# Patient Record
Sex: Female | Born: 1985 | Hispanic: Yes | Marital: Married | Smoking: Never smoker
Health system: Southern US, Community
[De-identification: ages and names within clinical notes are randomized; demographics above are authoritative.]

## PROBLEM LIST (undated history)

## (undated) ENCOUNTER — Inpatient Hospital Stay (HOSPITAL_COMMUNITY): Payer: Self-pay

## (undated) DIAGNOSIS — O4442 Low lying placenta NOS or without hemorrhage, second trimester: Secondary | ICD-10-CM

## (undated) DIAGNOSIS — Z789 Other specified health status: Secondary | ICD-10-CM

## (undated) DIAGNOSIS — O09292 Supervision of pregnancy with other poor reproductive or obstetric history, second trimester: Secondary | ICD-10-CM

## (undated) DIAGNOSIS — O008 Other ectopic pregnancy without intrauterine pregnancy: Secondary | ICD-10-CM

## (undated) DIAGNOSIS — O358XX0 Maternal care for other (suspected) fetal abnormality and damage, not applicable or unspecified: Secondary | ICD-10-CM

## (undated) HISTORY — DX: Other ectopic pregnancy without intrauterine pregnancy: O00.80

## (undated) HISTORY — PX: DILATION AND CURETTAGE OF UTERUS: SHX78

---
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
## 2005-10-05 ENCOUNTER — Inpatient Hospital Stay (HOSPITAL_COMMUNITY): Admission: AD | Admit: 2005-10-05 | Discharge: 2005-10-07 | Payer: Self-pay | Admitting: *Deleted

## 2012-05-10 ENCOUNTER — Ambulatory Visit: Payer: Self-pay | Admitting: Physician Assistant

## 2012-05-10 VITALS — BP 99/62 | HR 73 | Temp 98.1°F | Resp 16 | Ht 61.0 in | Wt 128.8 lb

## 2012-05-10 DIAGNOSIS — R82998 Other abnormal findings in urine: Secondary | ICD-10-CM

## 2012-05-10 DIAGNOSIS — M549 Dorsalgia, unspecified: Secondary | ICD-10-CM

## 2012-05-10 DIAGNOSIS — Z3490 Encounter for supervision of normal pregnancy, unspecified, unspecified trimester: Secondary | ICD-10-CM | POA: Insufficient documentation

## 2012-05-10 DIAGNOSIS — R109 Unspecified abdominal pain: Secondary | ICD-10-CM

## 2012-05-10 DIAGNOSIS — N912 Amenorrhea, unspecified: Secondary | ICD-10-CM

## 2012-05-10 LAB — POCT URINALYSIS DIPSTICK
Bilirubin, UA: NEGATIVE
Blood, UA: NEGATIVE
Glucose, UA: NEGATIVE
Nitrite, UA: POSITIVE
Protein, UA: NEGATIVE
Spec Grav, UA: >=1.03
Urobilinogen, UA: 0.2
pH, UA: 5.5

## 2012-05-10 LAB — POCT UA - MICROSCOPIC ONLY
Casts, Ur, LPF, POC: NEGATIVE
Crystals, Ur, HPF, POC: NEGATIVE
Yeast, UA: NEGATIVE

## 2012-05-10 LAB — POCT URINE PREGNANCY: Preg Test, Ur: POSITIVE

## 2012-05-10 NOTE — Progress Notes (Signed)
[address], [place] [address]   Phone [phone]  Subjective:    Patient ID: Lindsay Gutierrez, female    DOB: Mar 13, 1986, 26 y.o.   MRN: [id number]  HPI Pt presents to clinic with low back pain for about the last 2 wks and wants to make sure it is ok.  She has had a + pregnancy test at home.  She has used tylenol which has helped and heat.  The pain mainly gets her while she is sleeping and then 1st thing in the am but goes away as she starts to move.  She has not injured her back that she can remember.  She is having no abd pain, vaginal d/c, bleeding and no urinary symptoms.  This is her 2nd pregnancy, she has a 26 y/o at home.     Review of Systems  Constitutional: Negative for fever and chills.  Gastrointestinal: Negative for nausea.  Genitourinary: Negative for dysuria, urgency, frequency, vaginal bleeding, vaginal discharge and pelvic pain.  Musculoskeletal: Positive for back pain. Negative for myalgias, joint swelling and arthralgias.       No paresthesias and pain into legs, no weakness       Objective:   Physical Exam  Vitals reviewed. Constitutional: She is oriented to person, place, and time. She appears well-developed and well-nourished.  HENT:  Head: Normocephalic and atraumatic.  Right Ear: External ear normal.  Left Ear: External ear normal.  Eyes: Conjunctivae normal are normal.  Cardiovascular: Normal rate, regular rhythm and normal heart sounds.   No murmur heard. Pulmonary/Chest: Effort normal and breath sounds normal.  Abdominal: Soft. Bowel sounds are normal. There is no tenderness.  Musculoskeletal:       Lumbar back: She exhibits no tenderness, no bony tenderness, no swelling and no edema.  Neurological: She is alert and oriented to person, place, and time. She has normal strength. No cranial nerve deficit or sensory deficit.  Reflex Scores:      Bicep reflexes are 2+ on the right side and 2+ on the left side.      Brachioradialis reflexes are 2+ on  the right side and 2+ on the left side.      Patellar reflexes are 2+ on the right side and 2+ on the left side. Skin: Skin is warm and dry.  Psychiatric: She has a normal mood and affect. Her behavior is normal. Judgment and thought content normal.    Results for orders placed in visit on 05/10/12  POCT UA - MICROSCOPIC ONLY      Component Value Range   WBC, Ur, HPF, POC 6-12     RBC, urine, microscopic 0-1     Bacteria, U Microscopic 4++     Mucus, UA large     Epithelial cells, urine per micros 7-15     Crystals, Ur, HPF, POC neg     Casts, Ur, LPF, POC neg     Yeast, UA neg    POCT URINALYSIS DIPSTICK      Component Value Range   Color, UA yellow     Clarity, UA clear     Glucose, UA neg     Bilirubin, UA neg     Ketones, UA trace     Spec Grav, UA >=1.030     Blood, UA neg     pH, UA 5.5     Protein, UA neg     Urobilinogen, UA 0.2     Nitrite, UA positive     Leukocytes,  UA Trace    POCT URINE PREGNANCY      Component Value Range   Preg Test, Ur Positive           Assessment & Plan:   1. Back pain    2. Abdominal pain  POCT UA - Microscopic Only, POCT urinalysis dipstick, POCT urine pregnancy  3. Pregnancy    4. Amenorrhea    5. Urine white blood cells increased  Urine culture   1- believe musculoskeletal in origin.  Pt can continue heat and tylenol.  Pt to do back stretches. 2- pt stated she did not have abd pain 3- pt to call and make an appt for OB, results of preg test given, pt to take an OTC PNV 4- 5- due to pregnancy will send urine culture - i think it is a dirty catch but will make sure through culture  Pt to stay hydrated.  D/w pt warning signs of problems with the pregnancy and when the RTC.  If back pain continues she should RTC.

## 2012-05-10 NOTE — Patient Instructions (Addendum)
Call health department and make an OB appt. [phone]  Take a prenatal vitamin daily.  Baby is due 11/24/2012 - you are 11 wks and 5 day

## 2012-05-15 MED ORDER — NITROFURANTOIN MONOHYD MACRO 100 MG PO CAPS: 100.0000 mg | ORAL_CAPSULE | Freq: Two times a day (BID) | ORAL | Status: DC

## 2012-05-15 NOTE — Addendum Note (Signed)
Addended by: Morrell Riddle on: 05/15/2012 08:13 AM   Modules accepted: Orders

## 2012-05-25 ENCOUNTER — Ambulatory Visit: Payer: Self-pay | Admitting: Family Medicine

## 2012-05-25 VITALS — BP 108/70 | HR 82 | Temp 98.2°F | Resp 24 | Ht 61.5 in | Wt 128.2 lb

## 2012-05-25 DIAGNOSIS — O039 Complete or unspecified spontaneous abortion without complication: Secondary | ICD-10-CM

## 2012-05-25 MED ORDER — HYDROCODONE-ACETAMINOPHEN 5-325 MG PO TABS: 1.0000 | ORAL_TABLET | Freq: Four times a day (QID) | ORAL | Status: DC | PRN

## 2012-05-25 NOTE — Progress Notes (Signed)
   [address] [place] [address]   Phone [phone]  Subjective:    Patient ID: Lindsay Gutierrez, female    DOB: 09-04-1985, 26 y.o.   MRN: [id number]  HPI  Pt presents to clinic with abd cramping and vaginal bleeding.  She is 13wk 6d pregnant and 3 days ago developed some light vaginal bleeding and some minor cramping.  Then over the weekend she developed increased bleeding and cramping (today is the worst).  She is having no fever. She does not know her blood type but does not think that she got a shot/injection after her delivery.  Review of Systems  Genitourinary: Positive for vaginal bleeding and pelvic pain.       Objective:   Physical Exam  Vitals reviewed. Constitutional: She appears well-developed and well-nourished.  HENT:  Head: Normocephalic and atraumatic.  Right Ear: External ear normal.  Left Ear: External ear normal.  Cardiovascular: Normal rate, regular rhythm and normal heart sounds.   Pulmonary/Chest: Effort normal.  Genitourinary: Pelvic exam was performed with patient supine. There is bleeding (blood collected in the vaginal canal) around the vagina.       Internal os is opened with tissue passing through.  I am unable to remove the tissue with a large cotton swab so it was left in place.    CBC was ok with WBC 10.4, Hgb 12.4, HCT 41.0, PLT 315  Visit interpreted by Fab - a medical assistant.     Assessment & Plan:   1. Spontaneous miscarriage  Rh Type, hCG, quantitative, pregnancy, HYDROcodone-acetaminophen (NORCO/VICODIN) 5-325 MG per tablet   D/w pt through interpretor at length her options of [place] following [facility].  Pt will plan to f/u here for recheck on Friday when I will repeat her vaginal exam and repeat her quan HCG.  I d/w pt that we will follow her labs until her Hcg is normal.  D/w pt things to be worried about and when to go to the ED (fever, increased pain and bleeding).  I except her pain to be improved in  the next 48h due to the tissue being at the os.  D/w Dr Conley Rolls

## 2012-05-25 NOTE — Patient Instructions (Addendum)
If you develop a fever, increased constant abd pain or significantly worse bleeding please go to [facility] hospital. We will see you every 3-4 days until your hormone level goes to zero to make sure the miscarriage is complete and you have no risk of infection.  We will call you with the results of your labs when we get them.  Aborto espontneo  (Miscarriage) El aborto espontneo es la prdida de un beb que no ha nacido (feto) antes de la semana 20 del Psychiatrist. La mayor parte de estos abortos ocurre en los primeros 3 meses. En algunos casos ocurre antes de que la mujer sepa que est [place]. Tambin se denomina "aborto espontneo" o "prdida prematura del embarazo". El aborto espontneo puede ser Neomia Dear experiencia que afecte emocionalmente a Dealer. Converse con su mdico si tiene dudas, cmo es el proceso de [place], y sobre planes futuros de Psychiatrist.  CAUSAS   Algunos problemas cromosmicos pueden hacer imposible que el beb se desarrolle normalmente. Los problemas con los genes o cromosomas del beb son generalmente el resultado de errores que se producen, por casualidad, cuando el embrin se divide y crece. Estos problemas no se heredan de los [place].  Infeccin en el cuello del tero.   Problemas hormonales.   Problemas en el cuello del tero, como tener un tero incompetente. Esto ocurre cuando los tejidos no son lo suficientemente fuertes como para Arts administrator.   Problemas del tero, como un tero con forma anormal, los fibromas o anormalidades congnitas.   Ciertas enfermedades crnicas.   No fume, no beba alcohol, ni consuma drogas.   Traumatismos  A veces, la causa es desconocida.  SNTOMAS   Sangrado o manchado vaginal, con o sin clicos o dolor.  Dolor o clicos en el abdomen o en la cintura.  Eliminacin de lquido, tejidos o cogulos grandes por la vagina. DIAGNSTICO  El [employer] har un examen fsico. Tambin le indicar una ecografa para  confirmar el aborto. Es posible que se realicen anlisis de [place].  TRATAMIENTO   En algunos casos el tratamiento no es necesario, si se eliminan naturalmente todos los tejidos embrionarios que se encontraban en el tero. Si el feto o la placenta quedan dentro del tero (aborto incompleto), pueden infectarse, los tejidos que quedan pueden infectarse y deben retirarse. Generalmente se realiza un procedimiento de dilatacin y curetaje (D y C). Durante el procedimiento de dilatacin y curetaje, el cuello del tero se abre (dilata) y se retira cualquier resto de tejido fetal o placentario del tero.  Si hay una infeccin, le recetarn antibiticos. Podrn recetarle otros medicamentos para reducir el tamao del tero (contraerlo) si hay una mucho sangrado.  Si su sangre es Rh negativa y su beb es Rh positivo, usted necesitar la inyeccin de inmunoglobulina Rh. Esta inyeccin proteger a los futuros bebs de tener problemas de compatibilidad Rh en futuros embarazos. INSTRUCCIONES PARA EL CUIDADO EN EL HOGAR   El mdico le indicar reposo en cama o le permitir Dance movement psychotherapist. Vuelva a la actividad lentamente o segn las indicaciones de su mdico.  Pdale a alguien que la ayude con las responsabilidades familiares y del hogar durante este tiempo.   Lleve un registro de la cantidad y la saturacin de las toallas higinicas que Landscape architect. [place] esta informacin   No use tampones. No No se haga duchas vaginales ni tenga relaciones sexuales hasta que el mdico la autorice.   Slo tome medicamentos de venta libre o recetados para  calmar el dolor o el [place], segn las indicaciones de su mdico.   No tome aspirina. La aspirina puede ocasionar hemorragias.   Concurra puntualmente a las citas de control con el mdico.   Si usted o su pareja tienen dificultades con el duelo, hable con su mdico para buscar la [employer] ayude a Runner, broadcasting/film/video la prdida del  Psychiatrist. Permtase el tiempo suficiente de duelo antes de quedar embarazada nuevamente.  SOLICITE ATENCIN MDICA DE INMEDIATO SI:   Siente calambres intensos o dolor en la espalda o en el abdomen.  Tiene fiebre.  Elimina grandes cogulos de [place] (del tamao de una nuez o ms) o tejidos por la vagina. Guarde lo que ha eliminado para que su mdico lo examine.   La hemorragia aumenta.   Brett Fairy secrecin vaginal espesa y con mal olor.  Se siente mareada, dbil, o se desmaya.   Siente escalofros.  ASEGRESE DE QUE:   Comprende estas instrucciones.  Controlar su enfermedad.  Solicitar ayuda de inmediato si no mejora o si empeora. Document Released: 03/12/2005 Document Revised: 12/02/2011 ExitCare Patient Information 2013 [facility]

## 2012-05-26 LAB — HCG, QUANTITATIVE, PREGNANCY: hCG, Beta Chain, Quant, S: 865.3 m[IU]/mL

## 2012-05-27 LAB — RH TYPE: Rh Type: POSITIVE

## 2012-05-28 ENCOUNTER — Ambulatory Visit: Payer: Self-pay | Admitting: Physician Assistant

## 2012-05-28 VITALS — BP 112/74 | HR 72 | Temp 98.0°F | Resp 16 | Ht 61.5 in | Wt 128.0 lb

## 2012-05-28 DIAGNOSIS — O039 Complete or unspecified spontaneous abortion without complication: Secondary | ICD-10-CM

## 2012-05-28 DIAGNOSIS — O219 Vomiting of pregnancy, unspecified: Secondary | ICD-10-CM

## 2012-05-28 DIAGNOSIS — Z3490 Encounter for supervision of normal pregnancy, unspecified, unspecified trimester: Secondary | ICD-10-CM

## 2012-05-28 LAB — POCT CBC
Granulocyte percent: 57.2 %G (ref 37–80)
Hemoglobin: 12.4 g/dL (ref 12.2–16.2)
Lymph, poc: 3.7 — AB (ref 0.6–3.4)
MCH, POC: 29.7 pg (ref 27–31.2)
MCV: 93.1 fL (ref 80–97)
POC Granulocyte: 5.7 (ref 2–6.9)
POC LYMPH PERCENT: 36.8 %L (ref 10–50)
POC MID %: 6 %M (ref 0–12)
RDW, POC: 13.7 %
WBC: 10 10*3/uL (ref 4.6–10.2)

## 2012-05-28 LAB — HCG, QUANTITATIVE, PREGNANCY: hCG, Beta Chain, Quant, S: 101.5 m[IU]/mL

## 2012-05-28 NOTE — Progress Notes (Signed)
   [address], [place] [address]   Phone [phone]  Subjective:    Patient ID: Lindsay Gutierrez, female    DOB: 06-Oct-1985, 26 y.o.   MRN: [id number]  HPI  Pt presents to clinic for recheck miscarriage - She is having less bleeding - still cramping that is intermittent.  Has not had to use pain meds - using tylenol.  She is having no fevers or chills.  She is Rh +.  Review of Systems  Constitutional: Negative for fever and chills.  Genitourinary: Positive for vaginal bleeding and pelvic pain.       Objective:   Physical Exam  Vitals reviewed. Constitutional: She appears well-developed and well-nourished.  Pulmonary/Chest: Effort normal.  Abdominal: Soft. There is no tenderness.  Genitourinary: Cervix exhibits no motion tenderness. No vaginal discharge found.       Internal os is open and bleeding.    Results for orders placed in visit on 05/28/12  POCT CBC      Component Value Range   WBC 10.0  4.6 - 10.2 K/uL   Lymph, poc 3.7 (*) 0.6 - 3.4   POC LYMPH PERCENT 36.8  10 - 50 %L   MID (cbc) 0.6  0 - 0.9   POC MID % 6.0  0 - 12 %M   POC Granulocyte 5.7  2 - 6.9   Granulocyte percent 57.2  37 - 80 %G   RBC 4.17  4.04 - 5.48 M/uL   Hemoglobin 12.4  12.2 - 16.2 g/dL   HCT, POC 21.3  08.6 - 47.9 %   MCV 93.1  80 - 97 fL   MCH, POC 29.7  27 - 31.2 pg   MCHC 32.0  31.8 - 35.4 g/dL   RDW, POC 57.8     Platelet Count, POC 296  142 - 424 K/uL   MPV 9.5  0 - 99.8 fL        Assessment & Plan:   1. Miscarriage  POCT CBC, hCG, quantitative, pregnancy   Will check her quan hcg today and monitor for decreasing.  CBC was normal today.  Will determine need for f/u after labs return.  Answered patient's questions.

## 2012-09-17 ENCOUNTER — Encounter (HOSPITAL_COMMUNITY): Payer: Self-pay

## 2012-09-17 ENCOUNTER — Inpatient Hospital Stay (HOSPITAL_COMMUNITY)
Admission: AD | Admit: 2012-09-17 | Discharge: 2012-09-18 | Disposition: A | Discharge status: Home / Self Care | Payer: Self-pay | Source: Ambulatory Visit | Attending: Obstetrics and Gynecology | Admitting: Obstetrics and Gynecology

## 2012-09-17 ENCOUNTER — Inpatient Hospital Stay (HOSPITAL_COMMUNITY): Payer: Self-pay

## 2012-09-17 DIAGNOSIS — O26891 Other specified pregnancy related conditions, first trimester: Secondary | ICD-10-CM

## 2012-09-17 DIAGNOSIS — O009 Unspecified ectopic pregnancy without intrauterine pregnancy: Secondary | ICD-10-CM

## 2012-09-17 DIAGNOSIS — N949 Unspecified condition associated with female genital organs and menstrual cycle: Secondary | ICD-10-CM | POA: Insufficient documentation

## 2012-09-17 DIAGNOSIS — O99891 Other specified diseases and conditions complicating pregnancy: Secondary | ICD-10-CM | POA: Insufficient documentation

## 2012-09-17 DIAGNOSIS — R109 Unspecified abdominal pain: Secondary | ICD-10-CM | POA: Insufficient documentation

## 2012-09-17 HISTORY — DX: Other specified health status: Z78.9

## 2012-09-17 LAB — WET PREP, GENITAL: Yeast Wet Prep HPF POC: NONE SEEN

## 2012-09-17 LAB — URINALYSIS, ROUTINE W REFLEX MICROSCOPIC
Glucose, UA: NEGATIVE mg/dL
Protein, ur: NEGATIVE mg/dL

## 2012-09-17 LAB — POCT PREGNANCY, URINE: Preg Test, Ur: POSITIVE — AB

## 2012-09-17 LAB — CBC
HCT: 35.3 % — ABNORMAL LOW (ref 36.0–46.0)
MCHC: 33.1 g/dL (ref 30.0–36.0)
Platelets: 293 10*3/uL (ref 150–400)
RBC: 3.94 MIL/uL (ref 3.87–5.11)

## 2012-09-17 LAB — URINE MICROSCOPIC-ADD ON

## 2012-09-17 LAB — ABO/RH: ABO/RH(D): O POS

## 2012-09-17 NOTE — MAU Note (Signed)
Abdominal pain since 1700. Cramping that comes and goes. No bleeding or d/c. Had miscarriage 5mos ago and I'm worried.

## 2012-09-17 NOTE — MAU Provider Note (Signed)
History     CSN: [id number]  Arrival date and time: 09/17/12 2107   None     Chief Complaint  Patient presents with  . Abdominal Pain   HPI  Lindsay Gutierrez is a 27 y.o. [id number] who presents today with 5/10 abdominal pain. She states that this pain is similar to the pain she had when she had a miscarriage 5 months ago. She states her LMP was 08/31/12, but she states that a little different than normal.   Past Medical History  Diagnosis Date  . Medical history non-contributory     No past surgical history on file.  No family history on file.  History  Substance Use Topics  . Smoking status: Never Smoker   . Smokeless tobacco: Never Used  . Alcohol Use: No    Allergies: No Known Allergies  Prescriptions prior to admission  Medication Sig Dispense Refill  . Prenatal Vit-Fe Fumarate-FA (PRENATAL MULTIVITAMIN) TABS Take 1 tablet by mouth daily at 12 noon.        Review of Systems  Constitutional: Negative for fever.  Eyes: Negative for blurred vision.  Gastrointestinal: Positive for abdominal pain (see HPI). Negative for nausea, vomiting, diarrhea and constipation.  Genitourinary: Negative for dysuria, urgency and frequency.  Musculoskeletal: Negative for myalgias.  Neurological: Positive for headaches (occasinally). Negative for dizziness.   Physical Exam   Blood pressure 99/72, pulse 79, temperature 98.2 F (36.8 C), resp. rate 18, height 5\' 1"  (1.549 m), weight 55.611 kg (122 lb 9.6 oz), last menstrual period 08/31/2012, SpO2 100.00%.  Physical Exam  Nursing note and vitals reviewed. Constitutional: She is oriented to person, place, and time. She appears well-developed and well-nourished. No distress.  Cardiovascular: Normal rate.   Respiratory: Effort normal.  GI: Soft. She exhibits no distension. There is no tenderness.  Neurological: She is alert and oriented to person, place, and time.  Skin: Skin is warm and dry.    MAU Course  Procedures  Results for  orders placed during the hospital encounter of 09/17/12 (from the past 24 hour(s))  URINALYSIS, ROUTINE W REFLEX MICROSCOPIC     Status: Abnormal   Collection Time    09/17/12  9:30 PM      Result Value Range   Color, Urine YELLOW  YELLOW   APPearance CLEAR  CLEAR   Specific Gravity, Urine 1.020  1.005 - 1.030   pH 7.0  5.0 - 8.0   Glucose, UA NEGATIVE  NEGATIVE mg/dL   Hgb urine dipstick NEGATIVE  NEGATIVE   Bilirubin Urine NEGATIVE  NEGATIVE   Ketones, ur NEGATIVE  NEGATIVE mg/dL   Protein, ur NEGATIVE  NEGATIVE mg/dL   Urobilinogen, UA 1.0  0.0 - 1.0 mg/dL   Nitrite NEGATIVE  NEGATIVE   Leukocytes, UA TRACE (*) NEGATIVE  URINE MICROSCOPIC-ADD ON     Status: Abnormal   Collection Time    09/17/12  9:30 PM      Result Value Range   Squamous Epithelial / LPF FEW (*) RARE   WBC, UA 0-2  <3 WBC/hpf   RBC / HPF 0-2  <3 RBC/hpf   Bacteria, UA FEW (*) RARE  POCT PREGNANCY, URINE     Status: Abnormal   Collection Time    09/17/12  9:34 PM      Result Value Range   Preg Test, Ur POSITIVE (*) NEGATIVE  CBC     Status: Abnormal   Collection Time    09/17/12  9:35 PM  Result Value Range   WBC 11.0 (*) 4.0 - 10.5 K/uL   RBC 3.94  3.87 - 5.11 MIL/uL   Hemoglobin 11.7 (*) 12.0 - 15.0 g/dL   HCT 16.1 (*) 09.6 - 04.5 %   MCV 89.6  78.0 - 100.0 fL   MCH 29.7  26.0 - 34.0 pg   MCHC 33.1  30.0 - 36.0 g/dL   RDW 40.9  81.1 - 91.4 %   Platelets 293  150 - 400 K/uL  ABO/RH     Status: None   Collection Time    09/17/12  9:35 PM      Result Value Range   ABO/RH(D) O POS    WET PREP, GENITAL     Status: Abnormal   Collection Time    09/17/12 10:05 PM      Result Value Range   Yeast Wet Prep HPF POC NONE SEEN  NONE SEEN   Trich, Wet Prep NONE SEEN  NONE SEEN   Clue Cells Wet Prep HPF POC FEW (*) NONE SEEN   WBC, Wet Prep HPF POC FEW (*) NONE SEEN  HCG, QUANTITATIVE, PREGNANCY     Status: Abnormal   Collection Time    09/17/12 10:30 PM      Result Value Range   hCG, Beta Chain,  Quant, S 139 (*) <5 mIU/mL   USE: Reveals an intrauterine gestational sac with no fetal pole or cardiac activity seen at this early gestation.  Assessment and Plan   1. Pelvic pain complicating pregnancy, antepartum, first trimester    Repeat quant in 48 hours First trimester danger signs reviewed Tawnya Crook 09/17/2012, 9:51 PM

## 2012-09-18 NOTE — MAU Provider Note (Signed)
Attestation of Attending Supervision of Advanced Practitioner (CNM/NP): Evaluation and management procedures were performed by the Advanced Practitioner under my supervision and collaboration.  I have reviewed the Advanced Practitioner's note and chart, and I agree with the management and plan.  Kiyoshi Schaab 09/18/2012 7:45 AM

## 2012-09-19 ENCOUNTER — Inpatient Hospital Stay (HOSPITAL_COMMUNITY)
Admission: AD | Admit: 2012-09-19 | Discharge: 2012-09-19 | Disposition: A | Discharge status: Home / Self Care | Payer: Self-pay | Source: Ambulatory Visit | Attending: Obstetrics & Gynecology | Admitting: Obstetrics & Gynecology

## 2012-09-19 DIAGNOSIS — Z09 Encounter for follow-up examination after completed treatment for conditions other than malignant neoplasm: Secondary | ICD-10-CM | POA: Insufficient documentation

## 2012-09-19 DIAGNOSIS — O0281 Inappropriate change in quantitative human chorionic gonadotropin (hCG) in early pregnancy: Secondary | ICD-10-CM | POA: Insufficient documentation

## 2012-09-19 LAB — HCG, QUANTITATIVE, PREGNANCY: hCG, Beta Chain, Quant, S: 130 m[IU]/mL — ABNORMAL HIGH (ref <5)

## 2012-09-19 NOTE — MAU Provider Note (Signed)
S: 27 y.o. [id number] @[redacted]w[redacted]d  presents to MAU for follow up quantitative hcg.  She denies abdominal pain, vaginal bleeding, dizziness, or fever chills.   Hospital spanish translator used for all communication.  O: BP 107/74  Pulse 89  Resp 18  LMP 08/31/2012  Quant hcg 4/4: 139 4/6: 130  A: 1. 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

## 2012-09-19 NOTE — MAU Note (Signed)
Pt here for follow up BHCG. Pt denies pain or vaginal bleeding

## 2012-09-20 NOTE — MAU Provider Note (Signed)
Attestation of Attending Supervision of Advanced Practitioner (PA/CNM/NP): Evaluation and management procedures were performed by the Advanced Practitioner under my supervision and collaboration.  I have reviewed the Advanced Practitioner's note and chart, and I agree with the management and plan.  Jazzmyn Filion, MD, FACOG Attending Obstetrician & Gynecologist Faculty Practice, [facility]  

## 2012-09-21 ENCOUNTER — Inpatient Hospital Stay (HOSPITAL_COMMUNITY)
Admission: AD | Admit: 2012-09-21 | Discharge: 2012-09-21 | Disposition: A | Discharge status: Home / Self Care | Payer: Self-pay | Source: Ambulatory Visit | Attending: Obstetrics and Gynecology | Admitting: Obstetrics and Gynecology

## 2012-09-21 ENCOUNTER — Encounter (HOSPITAL_COMMUNITY): Payer: Self-pay | Admitting: *Deleted

## 2012-09-21 DIAGNOSIS — O00109 Unspecified tubal pregnancy without intrauterine pregnancy: Secondary | ICD-10-CM | POA: Insufficient documentation

## 2012-09-21 DIAGNOSIS — O009 Unspecified ectopic pregnancy without intrauterine pregnancy: Secondary | ICD-10-CM

## 2012-09-21 LAB — CBC WITH DIFFERENTIAL/PLATELET
Basophils Relative: 0 % (ref 0–1)
Eosinophils Absolute: 0.1 10*3/uL (ref 0.0–0.7)
MCH: 29.6 pg (ref 26.0–34.0)
MCHC: 33 g/dL (ref 30.0–36.0)
Neutrophils Relative %: 56 % (ref 43–77)
Platelets: 283 10*3/uL (ref 150–400)
RDW: 13 % (ref 11.5–15.5)

## 2012-09-21 LAB — BUN: BUN: 11 mg/dL (ref 6–23)

## 2012-09-21 LAB — CREATININE, SERUM
Creatinine, Ser: 0.65 mg/dL (ref 0.50–1.10)
GFR calc non Af Amer: >90 mL/min (ref >90)

## 2012-09-21 LAB — AST: AST: 17 U/L (ref 0–37)

## 2012-09-21 MED ORDER — METHOTREXATE INJECTION FOR WOMEN'S HOSPITAL
50.0000 mg/m2 | Freq: Once | INTRAMUSCULAR | Status: AC
  Administered 2012-09-21: 75 mg via INTRAMUSCULAR
  Filled 2012-09-21: qty 1.5

## 2012-09-21 NOTE — MAU Provider Note (Signed)
History     CSN: [id number]  Arrival date and time: 09/21/12 1957   None     No chief complaint on file.  HPI Lindsay Gutierrez is a 27 y.o. [phone] at [redacted]w[redacted]d who is here today for repeat BHCG. She denies any pain or bleeding at this time.   Past Medical History  Diagnosis Date  . Medical history non-contributory     No past surgical history on file.  No family history on file.  History  Substance Use Topics  . Smoking status: Never Smoker   . Smokeless tobacco: Never Used  . Alcohol Use: No    Allergies: No Known Allergies  Prescriptions prior to admission  Medication Sig Dispense Refill  . Prenatal Vit-Fe Fumarate-FA (PRENATAL MULTIVITAMIN) TABS Take 1 tablet by mouth daily at 12 noon.        Review of Systems  Constitutional: Negative for fever.  Gastrointestinal: Negative for nausea, vomiting and abdominal pain.  Genitourinary: Negative for dysuria, urgency and frequency.  Neurological: Negative for headaches.   Physical Exam   Blood pressure 98/63, pulse 79, temperature 98.1 F (36.7 C), temperature source Oral, resp. rate 20, height 5' (1.524 m), weight 122 lb 6 oz (55.509 kg), last menstrual period 08/31/2012.  Physical Exam  Constitutional: She is oriented to person, place, and time. She appears well-developed and well-nourished. No distress.  Cardiovascular: Normal rate.   Respiratory: Effort normal.  Neurological: She is alert and oriented to person, place, and time.  Skin: Skin is warm and dry.  Psychiatric: She has a normal mood and affect.    MAU Course  Procedures 09/17/12 HCG 139 09/19/12 HCG 130 09/21/12 HCG 115  2055: D/W Dr. Jolayne Panther offer patient MTX again today. If she refuses again plan to repeat HCG in 48 hours.   Very lengthy discussion with the patient and her husband with interpreter present. R/B/A to MTX discussed at length. Patient agreeable to proceeding with MTX.  Results for orders placed during the hospital encounter of 09/21/12  (from the past 24 hour(s))  HCG, QUANTITATIVE, PREGNANCY     Status: Abnormal   Collection Time    09/21/12  8:10 PM      Result Value Range   hCG, Beta Chain, Quant, S 115 (*) <5 mIU/mL  AST     Status: None   Collection Time    09/21/12  8:10 PM      Result Value Range   AST 17  0 - 37 U/L  BUN     Status: None   Collection Time    09/21/12  8:10 PM      Result Value Range   BUN 11  6 - 23 mg/dL  CREATININE, SERUM     Status: None   Collection Time    09/21/12  8:10 PM      Result Value Range   Creatinine, Ser 0.65  0.50 - 1.10 mg/dL   GFR calc non Af Amer >90  >90 mL/min   GFR calc Af Amer >90  >90 mL/min  CBC WITH DIFFERENTIAL     Status: Abnormal   Collection Time    09/21/12  8:10 PM      Result Value Range   WBC 10.8 (*) 4.0 - 10.5 K/uL   RBC 4.05  3.87 - 5.11 MIL/uL   Hemoglobin 12.0  12.0 - 15.0 g/dL   HCT 57.8  46.9 - 62.9 %   MCV 89.9  78.0 - 100.0 fL   MCH 29.6  26.0 - 34.0 pg   MCHC 33.0  30.0 - 36.0 g/dL   RDW 14.7  82.9 - 56.2 %   Platelets 283  150 - 400 K/uL   Neutrophils Relative 56  43 - 77 %   Neutro Abs 6.1  1.7 - 7.7 K/uL   Lymphocytes Relative 34  12 - 46 %   Lymphs Abs 3.7  0.7 - 4.0 K/uL   Monocytes Relative 8  3 - 12 %   Monocytes Absolute 0.8  0.1 - 1.0 K/uL   Eosinophils Relative 1  0 - 5 %   Eosinophils Absolute 0.1  0.0 - 0.7 K/uL   Basophils Relative 0  0 - 1 %   Basophils Absolute 0.0  0.0 - 0.1 K/uL    Assessment and Plan   1. Ectopic pregnancy    MTX given today Danger signs reviewed Return to MAU on Friday for repeat BHCG  Tawnya Crook 09/21/2012, 8:54 PM

## 2012-09-21 NOTE — MAU Note (Addendum)
D/C HOME- NO ALLERGIC REACTION NOTED OR REPORTED.  To return on friday

## 2012-09-21 NOTE — MAU Note (Signed)
PT SAYS  SHE WAS HERE ON  Friday-  TOLD TO RETURN ON Sunday- FOR LABS.  WAS TOLD TO RETURN TO  TODAY FOR LABS.   NO PAIN / NO CRAMPS

## 2012-09-22 NOTE — MAU Provider Note (Signed)
Attestation of Attending Supervision of Advanced Practitioner (CNM/NP): Evaluation and management procedures were performed by the Advanced Practitioner under my supervision and collaboration.  I have reviewed the Advanced Practitioner's note and chart, and I agree with the management and plan.  Mikkel Charrette 09/22/2012 8:37 AM

## 2012-09-29 ENCOUNTER — Encounter: Payer: Self-pay | Admitting: *Deleted

## 2012-10-12 ENCOUNTER — Other Ambulatory Visit: Payer: Self-pay

## 2012-10-12 DIAGNOSIS — O009 Unspecified ectopic pregnancy without intrauterine pregnancy: Secondary | ICD-10-CM

## 2012-10-12 DIAGNOSIS — Z3201 Encounter for pregnancy test, result positive: Secondary | ICD-10-CM

## 2012-10-12 LAB — HCG, QUANTITATIVE, PREGNANCY: hCG, Beta Chain, Quant, S: <2 m[IU]/mL

## 2012-10-19 ENCOUNTER — Other Ambulatory Visit: Payer: Self-pay

## 2012-10-27 ENCOUNTER — Encounter: Payer: Self-pay | Admitting: Obstetrics & Gynecology

## 2012-10-27 ENCOUNTER — Ambulatory Visit (INDEPENDENT_AMBULATORY_CARE_PROVIDER_SITE_OTHER): Payer: Self-pay | Admitting: Obstetrics & Gynecology

## 2012-10-27 VITALS — BP 99/72 | HR 78 | Temp 97.8°F | Ht 63.0 in | Wt 123.4 lb

## 2012-10-27 DIAGNOSIS — O008 Other ectopic pregnancy without intrauterine pregnancy: Secondary | ICD-10-CM

## 2012-10-27 DIAGNOSIS — N390 Urinary tract infection, site not specified: Secondary | ICD-10-CM

## 2012-10-27 HISTORY — DX: Other ectopic pregnancy without intrauterine pregnancy: O00.80

## 2012-10-27 LAB — POCT URINALYSIS DIP (DEVICE)
Glucose, UA: NEGATIVE mg/dL
Nitrite: NEGATIVE
Protein, ur: NEGATIVE mg/dL
Urobilinogen, UA: 0.2 mg/dL (ref 0.0–1.0)

## 2012-10-27 NOTE — Patient Instructions (Signed)
Aborto espontneo  (Miscarriage) El aborto espontneo es la prdida de un beb que no ha nacido (feto) antes de la semana 20 del embarazo. La mayor parte de estos abortos ocurre en los primeros 3 meses. En algunos casos ocurre antes de que la mujer sepa que est embarazada. Tambin se denomina "aborto espontneo" o "prdida prematura del embarazo". El aborto espontneo puede ser una experiencia que afecte emocionalmente a la persona. Converse con su mdico si tiene dudas, cmo es el proceso de duelo, y sobre planes futuros de embarazo.  CAUSAS   Algunos problemas cromosmicos pueden hacer imposible que el beb se desarrolle normalmente. Los problemas con los genes o cromosomas del beb son generalmente el resultado de errores que se producen, por casualidad, cuando el embrin se divide y crece. Estos problemas no se heredan de los padres.  Infeccin en el cuello del tero.   Problemas hormonales.   Problemas en el cuello del tero, como tener un tero incompetente. Esto ocurre cuando los tejidos no son lo suficientemente fuertes como para contener el embarazo.   Problemas del tero, como un tero con forma anormal, los fibromas o anormalidades congnitas.   Ciertas enfermedades crnicas.   No fume, no beba alcohol, ni consuma drogas.   Traumatismos  A veces, la causa es desconocida.  SNTOMAS   Sangrado o manchado vaginal, con o sin clicos o dolor.  Dolor o clicos en el abdomen o en la cintura.  Eliminacin de lquido, tejidos o cogulos grandes por la vagina. DIAGNSTICO  El mdico le har un examen fsico. Tambin le indicar una ecografa para confirmar el aborto. Es posible que se realicen anlisis de sangre.  TRATAMIENTO   En algunos casos el tratamiento no es necesario, si se eliminan naturalmente todos los tejidos embrionarios que se encontraban en el tero. Si el feto o la placenta quedan dentro del tero (aborto incompleto), pueden infectarse, los tejidos que quedan  pueden infectarse y deben retirarse. Generalmente se realiza un procedimiento de dilatacin y curetaje (D y C). Durante el procedimiento de dilatacin y curetaje, el cuello del tero se abre (dilata) y se retira cualquier resto de tejido fetal o placentario del tero.  Si hay una infeccin, le recetarn antibiticos. Podrn recetarle otros medicamentos para reducir el tamao del tero (contraerlo) si hay una mucho sangrado.  Si su sangre es Rh negativa y su beb es Rh positivo, usted necesitar la inyeccin de inmunoglobulina Rh. Esta inyeccin proteger a los futuros bebs de tener problemas de compatibilidad Rh en futuros embarazos. INSTRUCCIONES PARA EL CUIDADO EN EL HOGAR   El mdico le indicar reposo en cama o le permitir realizar actividades livianas. Vuelva a la actividad lentamente o segn las indicaciones de su mdico.  Pdale a alguien que la ayude con las responsabilidades familiares y del hogar durante este tiempo.   Lleve un registro de la cantidad y la saturacin de las toallas higinicas que utiliza cada da. Anote esta informacin   No use tampones. No No se haga duchas vaginales ni tenga relaciones sexuales hasta que el mdico la autorice.   Slo tome medicamentos de venta libre o recetados para calmar el dolor o el malestar, segn las indicaciones de su mdico.   No tome aspirina. La aspirina puede ocasionar hemorragias.   Concurra puntualmente a las citas de control con el mdico.   Si usted o su pareja tienen dificultades con el duelo, hable con su mdico para buscar la ayuda psicolgica que los ayude a enfrentar la prdida   del embarazo. Permtase el tiempo suficiente de duelo antes de quedar embarazada nuevamente.  SOLICITE ATENCIN MDICA DE INMEDIATO SI:   Siente calambres intensos o dolor en la espalda o en el abdomen.  Tiene fiebre.  Elimina grandes cogulos de sangre (del tamao de una nuez o ms) o tejidos por la vagina. Guarde lo que ha eliminado para  que su mdico lo examine.   La hemorragia aumenta.   Observa una secrecin vaginal espesa y con mal olor.  Se siente mareada, dbil, o se desmaya.   Siente escalofros.  ASEGRESE DE QUE:   Comprende estas instrucciones.  Controlar su enfermedad.  Solicitar ayuda de inmediato si no mejora o si empeora. Document Released: 03/12/2005 Document Revised: 12/02/2011 ExitCare Patient Information 2013 ExitCare, LLC.  

## 2012-10-27 NOTE — Progress Notes (Signed)
Patient ID: Lindsay Gutierrez, female   DOB: 10/02/1985, 27 y.o.   MRN: [id number]  Chief Complaint  Patient presents with  . Miscarriage    HPI Lindsay Gutierrez is a 27 y.o. female.  Early miscarriage vs ectopic pregnancy treated with MTX  5 wk ago. LMP 4/14. She would kie to conceive.   [id number] OB History   Grav Para Term Preterm Abortions TAB SAB Ect Mult Living   4 1 1  3  3   1       HPI  Past Medical History  Diagnosis Date  . Medical history non-contributory     History reviewed. No pertinent past surgical history.  History reviewed. No pertinent family history.  Social History History  Substance Use Topics  . Smoking status: Never Smoker   . Smokeless tobacco: Never Used  . Alcohol Use: No    No Known Allergies  Current Outpatient Prescriptions  Medication Sig Dispense Refill  . Prenatal Vit-Fe Fumarate-FA (PRENATAL MULTIVITAMIN) TABS Take 1 tablet by mouth daily at 12 noon.       No current facility-administered medications for this visit.    Review of Systems Review of Systems  Genitourinary: Negative for vaginal bleeding, vaginal discharge, menstrual problem and pelvic pain.    Blood pressure 99/72, pulse 78, temperature 97.8 F (36.6 C), temperature source Oral, height 5\' 3"  (1.6 m), weight 123 lb 6.4 oz (55.974 kg), last menstrual period 09/01/2012, unknown if currently breastfeeding.  Physical Exam Physical Exam  Constitutional: She is oriented to person, place, and time. She appears well-developed and well-nourished. No distress.  Abdominal: Soft. She exhibits no mass. There is no tenderness.  Genitourinary:  Deferred pelvic  Neurological: She is alert and oriented to person, place, and time.  Skin: Skin is warm and dry.  Psychiatric: She has a normal mood and affect. Her behavior is normal.    Data Reviewed Last beta HCG <2  Assessment    S/P presumed ectopic H/O miscarriage X3     Plan    RTC 3 month, Gyn exam, discuss fertility plans         Lindsay Gutierrez 10/27/2012, 4:28 PM

## 2013-06-16 NOTE — L&D Delivery Note (Signed)
Attestation of Attending Supervision of Advanced Practitioner (PA/CNM/NP): Evaluation and management procedures were performed by the Advanced Practitioner under my supervision and collaboration.  I have reviewed the Advanced Practitioner's note and chart, and I agree with the management and plan.  Jacob Stinson, DO Attending Physician Faculty Practice, [facility]  

## 2013-06-16 NOTE — L&D Delivery Note (Signed)
Delivery Note  PRE-OPERATIVE DIAGNOSIS:  1) [redacted]w[redacted]d pregnancy.   POST-OPERATIVE DIAGNOSIS:  1) [redacted]w[redacted]d pregnancy s/p Vaginal, Spontaneous Delivery   Delivery Type: Vaginal, Spontaneous Delivery   Delivery Clinician: Leanne Sisler   Delivery Anesthesia: Epidural   Labor Complications: None  Lacerations: None   ESTIMATED BLOOD LOSS: 400    Labor course: This is a 28 y.o. y.o. female [phone]  who came in at [redacted]w[redacted]d pregnancy complaining of contractions.  Her prenatal course was complicated by Fetal R chest mass noted on anatomy scan with CPAM type 3, h/o low birth weight infant, h/o low lying posterior placenta, resolved.  Initial cervical exam was 6/90/-1.  She was admitted to L and D.  Labor course included:  SROM    Procedure: Vaginal, Spontaneous Delivery    Date of birth: 03/07/2014   Time of birth: 2:15 AM    This [id number] woman under epidural anesthesia delivered a viable female  infant weighing Weight: 6 lb 8.8 oz (2.97 kg) (Filed from Delivery Summary)  with Apgars as listed below.  Delivery was via NSVD   Delivery completed and cord cut and clamped. Infant dried and stimulated. Infant handed to NICU team for eval. They deemed infant safe to go to maternal abdomen and NBN with mom with CXR w/in 24 hours. Cord pH not obtained. Active management of the third stage of labor performed. Intact placenta delivered spontaneously at 9/22  2:23 AM . Cervix, vagina explored and no lacerations noted. Uterus well contracted at end of delivery.  Mother and infant tolerated delivery well.   FINDINGS:   1) female infant, Apgar scores of 9    at 1 minute 9    at 5 minutes   2) 3 Vessel Cord  3) Nuchal: no  SPECIMENS: Placenta intact; Cord gases not obtained  COMPLICATIONS: none  DISPOSITION:  Infant to NBN

## 2013-07-20 ENCOUNTER — Ambulatory Visit: Payer: Self-pay | Admitting: Emergency Medicine

## 2013-07-20 VITALS — BP 99/66 | HR 76 | Temp 98.2°F | Resp 16 | Ht 61.0 in | Wt 124.0 lb

## 2013-07-20 DIAGNOSIS — N912 Amenorrhea, unspecified: Secondary | ICD-10-CM

## 2013-07-20 DIAGNOSIS — R110 Nausea: Secondary | ICD-10-CM

## 2013-07-20 DIAGNOSIS — O0001 Abdominal pregnancy with intrauterine pregnancy: Secondary | ICD-10-CM

## 2013-07-20 DIAGNOSIS — S335XXA Sprain of ligaments of lumbar spine, initial encounter: Secondary | ICD-10-CM

## 2013-07-20 DIAGNOSIS — S139XXA Sprain of joints and ligaments of unspecified parts of neck, initial encounter: Secondary | ICD-10-CM

## 2013-07-20 LAB — POCT URINE PREGNANCY: Preg Test, Ur: POSITIVE

## 2013-07-20 NOTE — Progress Notes (Signed)
[facility] [address], [place] [id number] [phone]  Date:  07/20/2013   Name:  Lindsay Gutierrez   DOB:  1986-05-12   MRN:  [id number]  PCP:  No PCP Per Patient    Chief Complaint: Back Pain   History of Present Illness:  Lindsay Gutierrez is a 28 y.o. very pleasant female patient who presents with the following:  No history of injury. Has pain in low back and across shoulders.  Pain is not radiating.  Has been present for three days.  No overuse.  No history of prior back injury.  No improvement with over the counter medications or other home remedies.  LMP 06/15/13.  [id number] Requests a pregnancy test.  Had positive test at home.  Denies other complaint or health concern today.   Patient Active Problem List   Diagnosis Date Noted  . Other ectopic pregnancy without intrauterine pregnancy 10/27/2012    Past Medical History  Diagnosis Date  . Medical history non-contributory     No past surgical history on file.  History  Substance Use Topics  . Smoking status: Never Smoker   . Smokeless tobacco: Never Used  . Alcohol Use: No    No family history on file.  No Known Allergies  Medication list has been reviewed and updated.  Current Outpatient Prescriptions on File Prior to Visit  Medication Sig Dispense Refill  . Prenatal Vit-Fe Fumarate-FA (PRENATAL MULTIVITAMIN) TABS Take 1 tablet by mouth daily at 12 noon.       No current facility-administered medications on file prior to visit.    Review of Systems:  As per HPI, otherwise negative.    Physical Examination: Filed Vitals:   07/20/13 1605  BP: 99/66  Pulse: 76  Temp: 98.2 F (36.8 C)  Resp: 16   Filed Vitals:   07/20/13 1605  Height: 5\' 1"  (1.549 m)  Weight: 124 lb (56.246 kg)   Body mass index is 23.44 kg/(m^2). Ideal Body Weight: Weight in (lb) to have BMI = 25: 132  GEN: WDWN, NAD, Non-toxic, A & O x 3 HEENT: Atraumatic, Normocephalic. Neck supple. No  masses, No LAD. Ears and Nose: No external deformity. CV: RRR, No M/G/R. No JVD. No thrill. No extra heart sounds. PULM: CTA B, no wheezes, crackles, rhonchi. No retractions. No resp. distress. No accessory muscle use. ABD: S, NT, ND, +BS. No rebound. No HSM. EXTR: No c/c/e NEURO Normal gait.  PSYCH: Normally interactive. Conversant. Not depressed or anxious appearing.  Calm demeanor.  Back:  Tender trapezius.  Neuro intact.  Low back tender.  Assessment and Plan: Back strain Pregnant Prenatals ReferOB  Signed,  Phillips OdorJeffery Tracen Mahler, MD  Results for orders placed in visit on 07/20/13  POCT URINE PREGNANCY      Result Value Range   Preg Test, Ur Positive      Patient insists on a work not putting her off work for the duration of her pregnancy for fear of having another ectopic pregnancy.  This was refused.  She will follow up with OB.

## 2013-07-20 NOTE — Patient Instructions (Signed)
Embarazo  (Pregnancy)  Si planea quedar embarazada, es una buena idea concertar una cita de preconcepción con el médico para poder lograr un estilo de vida saludable ante de quedar embarazada. Esto incluye dieta, peso, ejercicio, el tomar vitaminas prenatales en especial ácido fólico (ayuda a prevenir defectos en el cerebro y la médula espinal), evitar el alcohol, fumar, las drogas ilegales, problemas médicos (diabetes, convulsiones), historial familiar de problemas genéticos, condiciones de trabajo e inmunizaciones. Es mejor tener conocimiento de estas cosas y hacer algo antes de quedar embarazada.  Si está embarazada, es necesario que siga ciertas pautas para tener un bebé sano. Es muy importante realizar controles prenatales adecuados y seguir las indicaciones del profesional que la asiste. La atención prenatal incluye toda la asistencia médica que usted recibe antes del nacimiento del bebé. Esto ayuda a prevenir problemas durante el embarazo y el parto.  INSTRUCCIONES PARA EL CUIDADO DOMICILIARIO  · Comience las consultas prenatales alrededor de la 12ª semana de embarazo o lo antes posible. Al principio generalmente se programan cada mes. Se hacen más frecuentes en los 2 últimos meses antes del parto. Es importante que concurra a todas las citas con el profesional y siga sus instrucciones con respecto a los medicamentos que deba utilizar, a la actividad física y a la dieta.  · Durante el embarazo debe obtener nutrientes para usted y para su bebé. Consuma una dieta normal y bien balanceada. Elija alimentos como carne, pescado, leche y otros productos lácteos, vegetales, frutas, panes integrales y cereales El profesional le informará cuál es el aumento de peso ideal, según su peso y altura actuales. Beba gran cantidad de líquidos. Trate de beber 8 vasos de líquidos por día.  · El alcohol se asocia a cierto número de defectos del nacimiento, incluyendo el síndrome de alcoholismo fetal. Lo mejor es evitarlo  completamente El cigarrillo causa nacimientos prematuros y bebés de bajo peso al nacer. El consumo de alcohol y nicotina durante el embarazo también aumentan marcadamente la probabilidad de que el niño sea químicamente dependiente en etapas posteriores de su vida y puede contribuir al síndrome de muerte súbita infantil (SMSI)  · No consuma drogas.  · Solo tome medicamentos prescriptos o de venta libre que le haya recomendado el profesional. Algunos medicamentos pueden causar problemas genéticos y físicos al bebé  · Las náuseas matinales pueden aliviarse si come algunas galletitas saladas en la cama. Coma dos galletitas antes de levantarse por la mañana.  · Las relaciones sexuales pueden continuarse hasta casi el final del embarazo, si no se presentan otros problemas como pérdida prematura (antes de tiempo) de líquido amniótico, hemorragia vaginal, dolor durante las relaciones sexuales o dolor abdominal (en el vientre).  · Practique ejercicios con regularidad. Consulte con el profesional que la asiste si no sabe con certeza si determinados ejercicios son seguros.  · No utilice la bañera con agua caliente, baños turcos y saunas. Estos aumentan el riesgo de sufrir un desmayo o de pérdida del conocimiento, y así lastimarse usted o el bebé. La natación es un buen ejercicio. Descanse todo lo que pueda e incluya una siesta después de almorzar siempre que le sea posible, especialmente durante el tercer trimestre.  · Evite los olores y las sustancias químicas tóxicas.  · No use zapatos de tacones altos, podría perder el equilibrio y caer.  · No levante objetos de más de 2,5 kg. Si levanta un objeto, flexione las piernas y los muslos, y no la espalda.  · Evite los viajes largos, especialmente en   el tercer trimestre.  · Si debe viajar fuera de la ciudad o de su estado, lleve una copia de la historia clínica.  SOLICITE ATENCIÓN MÉDICA DE INMEDIATO SI:  · La temperatura oral se eleva sin motivo por encima de 38,9° C (102° F) o  según le indique el profesional que lo asiste.  · Tiene una pérdida de líquido por la vagina. Si sospecha una ruptura de las membranas, tómese la temperatura y llame al profesional para informarlo sobre esto.  · Observa unas pequeñas manchas o una hemorragia vaginal Notifique al profesional acerca de la cantidad y de cuántos apósitos está utilizando.  · Continúa teniendo náuseas y no obtiene alivio de los medicamentos que le han indicado, o vomita sangre o una sustancia similar a la borra del café.  · Presenta un dolor en la zona superior del abdomen.  · Siente molestias en el ligamento redondo en la parte abdominal baja. El profesional que la asiste la evaluará.  · Siente pequeñas contracciones del útero (matriz)  · No siente que el bebé se mueve, o percibe menos movimientos que antes.  · Siente dolor al orinar.  · Observa una hemorragia vaginal anormal.  · Tiene diarrea persistente.  · Sufre una cefalea grave.  · Tiene problemas visuales.  · Comienza a sentir debilidad muscular.  · Se siente mareada o sufre un desmayo.  · Comienza a sentir falta de aire.  · Siente dolor en el pecho.  · Sufre dolor en la espalda que se irradia hacia la pierna y el pie.  · Siente latidos cardíacos irregulares o la frecuencia cardíaca es muy rápida.  · Aumenta excesivamente de peso en un período breve (2,5 kg en 3 a 5 días)  · Se ve envuelta en una situación de violencia doméstica.  Document Released: 03/12/2005 Document Revised: 08/25/2011  ExitCare® Patient Information ©2014 ExitCare, LLC.

## 2013-09-19 LAB — SICKLE CELL SCREEN: Sickle Cell Screen: NORMAL

## 2013-09-19 LAB — OB RESULTS CONSOLE ANTIBODY SCREEN: Antibody Screen: NEGATIVE

## 2013-09-19 LAB — CYTOLOGY - PAP: Pap Smear: NEGATIVE

## 2013-09-19 LAB — OB RESULTS CONSOLE PLATELET COUNT: PLATELETS: 278 10*3/uL

## 2013-09-19 LAB — OB RESULTS CONSOLE GC/CHLAMYDIA
CHLAMYDIA, DNA PROBE: NEGATIVE
Gonorrhea: NEGATIVE

## 2013-09-19 LAB — OB RESULTS CONSOLE HEPATITIS B SURFACE ANTIGEN: Hepatitis B Surface Ag: NEGATIVE

## 2013-09-19 LAB — OB RESULTS CONSOLE RUBELLA ANTIBODY, IGM: Rubella: IMMUNE

## 2013-09-19 LAB — OB RESULTS CONSOLE HIV ANTIBODY (ROUTINE TESTING): HIV: NONREACTIVE

## 2013-09-19 LAB — OB RESULTS CONSOLE HGB/HCT, BLOOD
HCT: 32 %
HEMOGLOBIN: 10.8 g/dL

## 2013-09-19 LAB — OB RESULTS CONSOLE ABO/RH: RH Type: POSITIVE

## 2013-09-19 LAB — OB RESULTS CONSOLE RPR: RPR: NONREACTIVE

## 2013-09-19 LAB — OB RESULTS CONSOLE VARICELLA ZOSTER ANTIBODY, IGG: VARICELLA IGG: IMMUNE

## 2013-09-27 ENCOUNTER — Ambulatory Visit (INDEPENDENT_AMBULATORY_CARE_PROVIDER_SITE_OTHER): Payer: Self-pay | Admitting: Obstetrics & Gynecology

## 2013-09-27 ENCOUNTER — Encounter: Payer: Self-pay | Admitting: Obstetrics & Gynecology

## 2013-09-27 VITALS — BP 93/61 | Temp 98.5°F | Wt 123.9 lb

## 2013-09-27 DIAGNOSIS — O09299 Supervision of pregnancy with other poor reproductive or obstetric history, unspecified trimester: Secondary | ICD-10-CM

## 2013-09-27 DIAGNOSIS — Z789 Other specified health status: Secondary | ICD-10-CM

## 2013-09-27 DIAGNOSIS — O09292 Supervision of pregnancy with other poor reproductive or obstetric history, second trimester: Secondary | ICD-10-CM

## 2013-09-27 DIAGNOSIS — Z609 Problem related to social environment, unspecified: Secondary | ICD-10-CM

## 2013-09-27 HISTORY — DX: Supervision of pregnancy with other poor reproductive or obstetric history, second trimester: O09.292

## 2013-09-27 LAB — POCT URINALYSIS DIP (DEVICE)
Bilirubin Urine: NEGATIVE
GLUCOSE, UA: NEGATIVE mg/dL
Hgb urine dipstick: NEGATIVE
Ketones, ur: NEGATIVE mg/dL
NITRITE: NEGATIVE
Protein, ur: NEGATIVE mg/dL
Specific Gravity, Urine: 1.02 (ref 1.005–1.030)
UROBILINOGEN UA: 0.2 mg/dL (ref 0.0–1.0)
pH: 6 (ref 5.0–8.0)

## 2013-09-27 NOTE — Patient Instructions (Addendum)
Regrese a la clinica cuando tenga su cita. Si tiene problemas o preguntas, llama a la clinica o vaya a la sala de emergencia al [facility]. Segundo trimestre del Psychiatristembarazo  (Second Trimester of Pregnancy) El segundo trimestre del Psychiatristembarazo se extiende desde la semana 13 hasta la semana 28, del 4 al 6 mes. En general, es el momento del embarazo en el que se sentir mejor. Su organismo se ha adaptado a Charity fundraiserestar embarazada y comienza a Diplomatic Services operational officersentirse fsicamente mejor. En general las nuseas matutinas han disminuido o han desaparecido completamente. El segundo trimestre es tambin la poca en la que el feto se desarrolla rpidamente. Hacia el final del sexto mes, el beb mide aproximadamente 9 pulgadas (23 cm) y pesa alrededor de 1  libras (700 g). Es probable que Architectural technologistsienta mover al beb (dar pataditas) entre las 18 y 20 semanas del Psychiatristembarazo.  CAMBIOS CORPORALES  Su organismo atravesar numerosos cambios durante el Wilsonembarazo. Los cambios varan de una mujer a Educational psychologistotra.   Seguir American Standard Companiesaumentando de peso. Notar que la parte baja del abdomen se ensancha.  Podrn aparecer las primeras estras en las caderas, abdomen y [place]mamas.  Es posible que sienta cefaleas, que se pueden Paramedicaliviar con los medicamentos que su mdico le autorice a Chemical engineerutilizar.  Tendr necesidad de orinar con ms frecuencia porque el feto est presionando en la vejiga.  Como consecuencia del Psychiatristembarazo, podr sentir Actoracidez estomacal continuamente.  Podr estar constipada ya que ciertas hormonas hacen que los msculos que New York Life Insuranceempujan los desechos a travs de los intestinos trabajen ms lentamente.  Pueden aparecer hemorroides o abultarse las venas (venas varicosas).  El dolor de espalda se debe al aumento de peso y a que las hormonas del Management consultantembarazo relajan las articulaciones entre los huesos de la pelvis y a la modificacin del peso y a los msculos que soportan el equilibrio.  Sus mamas seguirn desarrollndose y estarn ms sensibles.  Las Print production plannerencas pueden  sangrar y estar sensibles al cepillado y al hilo dental.  Pueden aparecer en el rostro zonas oscuras o manchas ([place] Citycloasma, mscara del Alpineembarazo). Esto desaparece despus del nacimiento del beb.  Es posible que se formeuna lnea oscura desde el ombligo a la zona del pubis (linea nigra). Esto desaparece despus del nacimiento del beb. QU DEBE ESPERAR EN LAS CONSULTAS PRENATALES  Durante una visita prenatal de rutina:   La pesarn para verificar que usted y el feto se encuentran dentro de los lmites normales.  Le tomarn la presin arterial.  Le medirn el abdomen para verificar el desarrollo del beb.  Escucharn los latidos fetales.  Se evaluarn los resultados de los estudios realizados en visitas anteriores. El mdico le preguntar:   Cmo se siente.  Si siente los movimientos del beb.  Si tiene sntomas anormales, como prdida de lquido, Yuccasangrado, dolores de cabeza intensos o clicos abdominales.  Si tiene [place]alguna duda. Otros estudios que podrn realizarse durante el segundo trimestre son:   Anlisis de sangre para evaluar:  Niveles bajos de hierro (anemia).  Diabetes gestacional (entre las 24 y las 28 [address]as).  Anticuerpos Rh.  Anlisis de orina para detectar infecciones, diabetes o protenas en la orina.  Una ecografa para confirmar si el crecimiento y el desarrollo del beb son los Eastviewadecuados.  Una amniocentesis para diagnosticar posibles problemas genticos.  Estudios del feto para descartar espina bfida y sndrome de [place]. INSTRUCCIONES PARA EL CUIDADO EN EL HOGAR   Evite fumar, consumir hierbas, beber alcohol y Chemical engineerutilizar frmacos que no ne hayan recetado.  Estas sustancias qumicas afectan la formacin y el desarrollo del beb.  Siga las indicaciones del profesional con respecto a Adult nursecomo tomar los medicamentos. Durante el embarazo, hay medicamentos que son seguros y otros no lo son.  Realice actividad fsica slo segn las indicaciones del mdico. Sentir  clicos uterinos es el mejor signo para Restaurant manager, fast fooddetener la actividad fsica.  Contine haciendo comidas regulares y sanas.  Use un sostn que le brinde buen soporte si sus mamas estn sensibles.  No utilice la baera con agua caliente, baos turcos y saunas.  Colquese el cinturn de seguridad cuando conduzca.  Evite comer carne cruda y el contacto con los utensilios y desperdicios de los gatos. Estos elementos contienen grmenes que pueden causar defectos de nacimiento en el beb.  Tome las vitaminas indicadas para la etapa prenatal.  Pruebe un laxante (si el mdico la autoriza) si tiene constipacin. Consuma ms alimentos ricos en fibra, como vegetales y frutas frescos y cereales enteros. Beba gran cantidad de lquido para mantener la orina de tono claro o amarillo plido.  Tome baos de agua tibia para Primary school teachercalmar el dolor o las molestias causadas por las hemorroides. Use una crema para las hemorroides si el mdico la autoriza.  Si tiene venas varicosas, use medias de soporte. Eleve los pies durante 15 minutos, 3 o 4 veces por da. Limite el consumo de sal en su dieta.  Evite levantar objetos pesados, use zapatos de tacones bajos y Brazilmantenga una buena postura.  Descanse con las piernas elevadas si tiene calambres o dolor de cintura.  Visite a su dentista si no lo ha Occupational hygienisthecho durante el embarazo. Use un cepillo de dientes blando para higienizarse los dientes y use suavemente el hilo dental.  Puede continuar su vida sexual siempre que el mdico la autorice.  Concurra a todas las visitas prenatales segn las indicaciones de su mdico. SOLICITE ATENCIN MDICA SI:   Santa Generaiene mareos.  Siente clicos leves, presin en la pelvis o dolor persistente en el abdomen.  Tiene nuseas o vmitos o diarrea persistentes.  Brett Fairybserva una secrecin vaginal con mal olor.  Siente dolor al ConocoPhillipsorinar. SOLICITE ATENCIN MDICA DE INMEDIATO SI:   Tiene fiebre.  Pierde lquido por la vagina.  Tiene sangrando o pequeas  prdidas vaginales.  Siente dolor intenso o clicos en el abdomen.  Sube o baja de peso rpidamente.  Tiene dificultad para respirar y Geographical information systems officerle duele pecho.  Sbitamente se le hincha el rostro, las manos, los tobillos, los pies o las piernas de Pocassetmanera extrema.  No ha sentido los movimientos del beb durante Georgianne Fickuna hora.  Siente un dolor de cabeza intenso que no se alivia con medicamentos.  Su visin se modifica. Document Released: 03/12/2005 Document Revised: 02/02/2013 [place] [facility] Patient Information 2014 [place]ExitCare, [place]LLC.

## 2013-09-27 NOTE — Progress Notes (Signed)
P= 80 Discussed appropriate weight gain (25-35lb); pt. Verbalized understanding. New OB packet given. Pt. Up to date on lab work from transfer [facility].  Ultrasound scheduled today.

## 2013-09-27 NOTE — Progress Notes (Signed)
[facility] for history of LBW infant in previous pregnancy (5 lbs 6 oz at 39 weeks). History of SAB x 2, last pregnancy was an ectopic pregnancy treated with MTX.  Patient is Spanish-speaking only, Spanish interpreter present for this encounter.  Currently being treated for UTI, [facility].  No other complications thus far this pregnancy. Quad screen offered today, patient agreed.  Quad screen ordered.  Anatomy scan also ordered.   No other complaints or concerns.  Routine obstetric precautions reviewed.   Return in 4 weeks or earlier if needed.

## 2013-09-29 ENCOUNTER — Encounter: Payer: Self-pay | Admitting: Obstetrics & Gynecology

## 2013-09-29 LAB — AFP, QUAD SCREEN
AFP: 30.1 [IU]/mL
Curr Gest Age: 14.6 wks.days
HCG TOTAL: 29895 m[IU]/mL
INH: 241.1 pg/mL
Interpretation-AFP: NEGATIVE
MOM FOR HCG: 0.87
MOM FOR INH: 1.04
MoM for AFP: 1.05
OPEN SPINA BIFIDA: NEGATIVE
Osb Risk: 1:11200 {titer}
Tri 18 Scr Risk Est: NEGATIVE
uE3 Mom: 3.18
uE3 Value: 1 ng/mL

## 2013-10-24 ENCOUNTER — Ambulatory Visit (HOSPITAL_COMMUNITY)
Admission: RE | Admit: 2013-10-24 | Discharge: 2013-10-24 | Disposition: A | Discharge status: Home / Self Care | Payer: Medicaid Other | Source: Ambulatory Visit | Attending: Obstetrics & Gynecology | Admitting: Obstetrics & Gynecology

## 2013-10-24 DIAGNOSIS — Z603 Acculturation difficulty: Secondary | ICD-10-CM

## 2013-10-24 DIAGNOSIS — O365990 Maternal care for other known or suspected poor fetal growth, unspecified trimester, not applicable or unspecified: Secondary | ICD-10-CM | POA: Insufficient documentation

## 2013-10-24 DIAGNOSIS — O09292 Supervision of pregnancy with other poor reproductive or obstetric history, second trimester: Secondary | ICD-10-CM

## 2013-10-24 DIAGNOSIS — Z789 Other specified health status: Secondary | ICD-10-CM

## 2013-10-24 DIAGNOSIS — O09299 Supervision of pregnancy with other poor reproductive or obstetric history, unspecified trimester: Secondary | ICD-10-CM | POA: Insufficient documentation

## 2013-10-24 DIAGNOSIS — Z3689 Encounter for other specified antenatal screening: Secondary | ICD-10-CM | POA: Insufficient documentation

## 2013-10-25 ENCOUNTER — Encounter: Payer: Self-pay | Admitting: Obstetrics & Gynecology

## 2013-10-25 ENCOUNTER — Ambulatory Visit (INDEPENDENT_AMBULATORY_CARE_PROVIDER_SITE_OTHER): Payer: Medicaid Other | Admitting: Obstetrics & Gynecology

## 2013-10-25 VITALS — BP 102/64 | HR 84 | Wt 126.5 lb

## 2013-10-25 DIAGNOSIS — O358XX0 Maternal care for other (suspected) fetal abnormality and damage, not applicable or unspecified: Secondary | ICD-10-CM

## 2013-10-25 DIAGNOSIS — O4440 Low lying placenta NOS or without hemorrhage, unspecified trimester: Secondary | ICD-10-CM

## 2013-10-25 DIAGNOSIS — O4442 Low lying placenta NOS or without hemorrhage, second trimester: Secondary | ICD-10-CM

## 2013-10-25 DIAGNOSIS — O4410 Placenta previa with hemorrhage, unspecified trimester: Secondary | ICD-10-CM

## 2013-10-25 DIAGNOSIS — Z609 Problem related to social environment, unspecified: Secondary | ICD-10-CM

## 2013-10-25 DIAGNOSIS — O09299 Supervision of pregnancy with other poor reproductive or obstetric history, unspecified trimester: Secondary | ICD-10-CM

## 2013-10-25 DIAGNOSIS — O09292 Supervision of pregnancy with other poor reproductive or obstetric history, second trimester: Secondary | ICD-10-CM

## 2013-10-25 DIAGNOSIS — Z789 Other specified health status: Secondary | ICD-10-CM

## 2013-10-25 DIAGNOSIS — Q249 Congenital malformation of heart, unspecified: Secondary | ICD-10-CM

## 2013-10-25 HISTORY — DX: Low lying placenta nos or without hemorrhage, second trimester: O44.42

## 2013-10-25 HISTORY — DX: Maternal care for other (suspected) fetal abnormality and damage, not applicable or unspecified: O35.8XX0

## 2013-10-25 LAB — POCT URINALYSIS DIP (DEVICE)
BILIRUBIN URINE: NEGATIVE
GLUCOSE, UA: NEGATIVE mg/dL
Hgb urine dipstick: NEGATIVE
Ketones, ur: NEGATIVE mg/dL
LEUKOCYTES UA: NEGATIVE
NITRITE: NEGATIVE
Protein, ur: NEGATIVE mg/dL
Specific Gravity, Urine: >=1.03 (ref 1.005–1.030)
Urobilinogen, UA: 0.2 mg/dL (ref 0.0–1.0)
pH: 5 (ref 5.0–8.0)

## 2013-10-25 MED ORDER — BETAMETHASONE SOD PHOS & ACET 6 (3-3) MG/ML IJ SUSP
12.0000 mg | Freq: Once | INTRAMUSCULAR | Status: AC
  Administered 2013-10-25: 12 mg via INTRAMUSCULAR

## 2013-10-25 NOTE — Progress Notes (Signed)
Pain-lower back and vaginal when she stands up for a long time Pt reports only getting 10 minutes for lunch at work; Pt given work restriction letter Andee PolesKailyn called from MFM and informed me that the pt came in yesterday, 10/24/13, and her US resulted CPAM and the Dr. Sherrie Georgeecker would like for the pt to get betamethasone today in her visit.  Informed Dr. Marice Potterove, agreed.

## 2013-10-25 NOTE — Progress Notes (Signed)
Routine visit. Some FM. Anatomy u/s yesterday with low lying placenta and fetal chest mass on right noted. BMZ today and tomorrow. Fetal echo this Friday with f/u with MFM then. I gave her a note for more frequent breaks at work [employer]

## 2013-10-26 ENCOUNTER — Other Ambulatory Visit (HOSPITAL_COMMUNITY): Payer: Self-pay | Admitting: Maternal and Fetal Medicine

## 2013-10-26 ENCOUNTER — Ambulatory Visit (INDEPENDENT_AMBULATORY_CARE_PROVIDER_SITE_OTHER): Payer: Medicaid Other | Admitting: *Deleted

## 2013-10-26 VITALS — BP 97/65 | HR 62 | Wt 126.0 lb

## 2013-10-26 DIAGNOSIS — O358XX0 Maternal care for other (suspected) fetal abnormality and damage, not applicable or unspecified: Secondary | ICD-10-CM

## 2013-10-26 DIAGNOSIS — O09292 Supervision of pregnancy with other poor reproductive or obstetric history, second trimester: Secondary | ICD-10-CM

## 2013-10-26 DIAGNOSIS — O09299 Supervision of pregnancy with other poor reproductive or obstetric history, unspecified trimester: Secondary | ICD-10-CM

## 2013-10-26 MED ORDER — BETAMETHASONE SOD PHOS & ACET 6 (3-3) MG/ML IJ SUSP
12.0000 mg | Freq: Once | INTRAMUSCULAR | Status: AC
  Administered 2013-10-26: 12 mg via INTRAMUSCULAR

## 2013-10-28 ENCOUNTER — Encounter (HOSPITAL_COMMUNITY): Payer: Self-pay

## 2013-10-28 ENCOUNTER — Ambulatory Visit (HOSPITAL_COMMUNITY): Payer: Self-pay

## 2013-10-28 ENCOUNTER — Other Ambulatory Visit (HOSPITAL_COMMUNITY): Payer: Self-pay | Admitting: Maternal and Fetal Medicine

## 2013-10-28 ENCOUNTER — Ambulatory Visit (HOSPITAL_COMMUNITY)
Admission: RE | Admit: 2013-10-28 | Discharge: 2013-10-28 | Disposition: A | Discharge status: Home / Self Care | Payer: Medicaid Other | Source: Ambulatory Visit | Attending: Obstetrics & Gynecology | Admitting: Obstetrics & Gynecology

## 2013-10-28 DIAGNOSIS — O358XX0 Maternal care for other (suspected) fetal abnormality and damage, not applicable or unspecified: Secondary | ICD-10-CM | POA: Insufficient documentation

## 2013-10-28 DIAGNOSIS — Z3689 Encounter for other specified antenatal screening: Secondary | ICD-10-CM | POA: Insufficient documentation

## 2013-10-30 ENCOUNTER — Encounter (HOSPITAL_COMMUNITY): Payer: Self-pay | Admitting: *Deleted

## 2013-10-30 ENCOUNTER — Inpatient Hospital Stay (HOSPITAL_COMMUNITY)
Admission: AD | Admit: 2013-10-30 | Discharge: 2013-10-30 | Disposition: A | Discharge status: Home / Self Care | Payer: Medicaid Other | Source: Ambulatory Visit | Attending: Obstetrics & Gynecology | Admitting: Obstetrics & Gynecology

## 2013-10-30 DIAGNOSIS — N390 Urinary tract infection, site not specified: Secondary | ICD-10-CM | POA: Insufficient documentation

## 2013-10-30 DIAGNOSIS — O2390 Unspecified genitourinary tract infection in pregnancy, unspecified trimester: Secondary | ICD-10-CM

## 2013-10-30 DIAGNOSIS — N949 Unspecified condition associated with female genital organs and menstrual cycle: Secondary | ICD-10-CM

## 2013-10-30 DIAGNOSIS — O2340 Unspecified infection of urinary tract in pregnancy, unspecified trimester: Secondary | ICD-10-CM

## 2013-10-30 DIAGNOSIS — R1031 Right lower quadrant pain: Secondary | ICD-10-CM | POA: Insufficient documentation

## 2013-10-30 DIAGNOSIS — M549 Dorsalgia, unspecified: Secondary | ICD-10-CM | POA: Insufficient documentation

## 2013-10-30 LAB — CBC
HCT: 30.5 % — ABNORMAL LOW (ref 36.0–46.0)
Hemoglobin: 10.2 g/dL — ABNORMAL LOW (ref 12.0–15.0)
MCH: 31.1 pg (ref 26.0–34.0)
MCHC: 33.4 g/dL (ref 30.0–36.0)
MCV: 93 fL (ref 78.0–100.0)
PLATELETS: 271 10*3/uL (ref 150–400)
RBC: 3.28 MIL/uL — AB (ref 3.87–5.11)
RDW: 13.7 % (ref 11.5–15.5)
WBC: 13.7 10*3/uL — AB (ref 4.0–10.5)

## 2013-10-30 LAB — URINALYSIS, ROUTINE W REFLEX MICROSCOPIC
Bilirubin Urine: NEGATIVE
Glucose, UA: NEGATIVE mg/dL
Hgb urine dipstick: NEGATIVE
Ketones, ur: NEGATIVE mg/dL
NITRITE: NEGATIVE
Protein, ur: NEGATIVE mg/dL
SPECIFIC GRAVITY, URINE: 1.01 (ref 1.005–1.030)
UROBILINOGEN UA: 0.2 mg/dL (ref 0.0–1.0)
pH: 7 (ref 5.0–8.0)

## 2013-10-30 LAB — URINE MICROSCOPIC-ADD ON

## 2013-10-30 MED ORDER — CEPHALEXIN 500 MG PO CAPS: 500.0000 mg | ORAL_CAPSULE | Freq: Four times a day (QID) | ORAL | Status: DC

## 2013-10-30 NOTE — Discharge Instructions (Signed)
Embarazo e infeccin del tracto urinario (Pregnancy and Urinary Tract Infection) Una infeccin urinaria (IU) puede ocurrir en Clinical cytogeneticist del tracto urinario. La infeccin urinaria puede [employer] and [employer]s utteres, los riones (pielonefritis), la vejiga (cistitis) y Geologist, engineering (uretritis). Todas las mujeres embarazadas deben ser estudiadas para diagnosticar la presencia de bacterias en el tracto urinario. La identificacin y el tratamiento de una infeccin urinaria disminuye el riesgo de un parto prematuro y de Actor infecciones ms graves en la madre y el beb. CAUSAS Las bacterias causan casi todas las infecciones urinarias.  FACTORES DE RIESGO Hay muchos factores que pueden aumentar sus probabilidades de contraer una infeccin urinaria (IU) durante el [place]. Pueden ser:  Lucilla Edin uretra corta.  Falta de aseo y malos hbitos de higiene.  [place].  Obstruccin de la orina en el tracto urinario.  Problemas con los msculos o nervios plvicos.  Diabetes.  Obesidad.  Problemas en la vejiga despus de tener varios hijos.  Antecedentes de infeccin urinaria. SIGNOS Y SNTOMAS   Dolor, ardor o sensacin de ardor al [employer].  Sentir la necesidad de Garment/textile technologist de inmediato [place]).  Prdida del control vesical (incontinencia urinaria).  Orinar con ms frecuencia de lo comn en el embarazo.  Malestar en la zona inferior del abdomen o en la espalda.  Bennie Hind turbia.  Sangre en la orina (hematuria).  Cristy Hilts. Cuando se infectan los riones, los sntomas pueden ser:  Dolor de espalda.  Dolor lateral en el lado derecho ms que en el lado izquierdo.  Cristy Hilts.  Escalofros.  Nuseas.  Vmitos. DIAGNSTICO  Una infeccin del tracto urinario se suele diagnosticar a travs de la orina. A veces se realizan pruebas y procedimientos adicionales. Estos pueden ser:  Steward Drone de los riones, los urteres, la vejiga y Geologist, engineering.  Observar la vejiga con un  tubo que ilumina (cistoscopa). TRATAMIENTO Por lo general, las IU pueden tratarse con medicamentos antibiticos.  INSTRUCCIONES PARA EL CUIDADO EN EL HOGAR   Tome slo medicamentos de venta libre o recetados, segn las indicaciones del mdico. Si le han recetado antibiticos, tmelos segn las indicaciones. Tmelos todos, aunque se sienta mejor.  Beba suficiente lquido para Consulting civil engineer orina clara o de color amarillo plido.  No tenga relaciones sexuales hasta que la infeccin haya desaparecido o el mdico la autorice.  Asegrese de [place] hagan estudios para Hydrographic surveyor una infeccin urinaria durante el [place]. Estas infecciones suelen reaparecer. Para prevenir una infeccin urinaria en el futuro  Practique buenos hbitos higinicos. Siempre debe limpiarse desde adelante hacia atrs. Use el tissue slo una vez.  No retenga la orina. Orine tan pronto como sea posible cuando tenga ganas.  No se haga duchas vaginales ni use desodorantes en aerosol.  Lave con agua tibia y jabn alrededor de la zona genital y el ano.  Vace la vejiga antes y despus de Clinical biochemist.  Use ropa interior con algodn en la entrepierna.  Evite la cafena y las bebidas gaseosas. Estas sustancias irritan la vejiga.  Beba jugo de arndanos o tome comprimidos de arndano. Esto puede disminuir el riesgo de sufrir una infeccin urinaria.  No beba alcohol.  Cumpla con las visitas de control y hgase todos los anlisis segn lo programado. SOLICITE ATENCIN MDICA SI:   Los sntomas empeoran.  Tiene fiebre an despus de 2 das [place].  Tiene una erupcin.  Siente que usted tiene problemas con los medicamentos recetados.  Tiene flujo vaginal anormal. SOLICITE ATENCIN MDICA DE INMEDIATO SI:  Siente dolor en la espalda o a los lados.  Tiene escalofros.  Observa sangre en la orina.  Tiene nuseas o vmitos.  Siente contracciones en el tero.  Tiene una  perdida de lquido en chorro por la vagina. ASEGRESE DE QUE:  Comprende estas instrucciones.   Controlar su afeccin.   Recibir ayuda de inmediato si no mejora o si empeora.  Document Released: 02/25/2012 Document Revised: 03/23/2013 [facility] Patient Information 2014 [place], [place].  Ronda ligamento dolor Academic librariandurante el embarazo Dolor del ligamento redondo es un dolor o sensacin punzante agudo suele sentir en el rea del vientre o la ingle inferior en uno o ambos lados. Es una de las quejas ms comunes durante el [place] Valleyembarazo y se Oralia Manisconsidera una parte normal del Psychiatristembarazo. Con mayor frecuencia se sinti durante el segundo trimestre.  Esto es lo que necesitas saber sobre el dolor del ligamento redondo, incluyendo algunos consejos para ayudarle a Actorsentirse mejor.  Causas de dolor del ligamento redondo  Varios ligamentos gruesos rodean y apoyan su International aid/development workermatriz (tero) a medida que crece Academic librariandurante el embarazo. Uno de ellos se llama el ligamento redondo.  El ligamento redondo Time Warnerconecta la parte frontal de la matriz de la ingle, el rea donde las piernas se unen a tu pelvis. El ligamento redondo normalmente se contrae y se relaja lentamente.  A medida que su beb y el tero crezca, los estiramientos de ligamentos redondos. Eso hace que sea ms probable que se convierta en tensa.  Los movimientos bruscos pueden causar el ligamento para apretar rpidamente, como un chasquido banda de goma. Esto provoca una sensacin punzante repentino y rpido.  Los sntomas de dolor del ligamento redondo  Dolor del ligamento redondo puede ser preocupante e incmoda. Pero se considera normal ya que su cuerpo cambia durante el [place]embarazo.  Los sntomas de dolor del ligamento redondo incluyen un agudo, sbito espasmo en el vientre. Por lo general, afecta el lado derecho, pero puede ocurrir en ambos lados. El dolor slo dura unos segundos.  El ejercicio puede causar Chief Technology Officerel dolor, al igual que los movimientos rpidos  como:  estornudos tos riendo rodando en la cama levantarse muy rpido  Poy Sippiratamiento del dolor del ligamento redondo  Estos son algunos consejos que pueden ayudar a reducir su Dentistmalestar:  El alivio del Engineer, miningdolor. Tome over-the-counter paracetamol para el dolor, si es necesario. Pregntele a su mdico si esto est bien.  Ejercicio. Haga mucho ejercicio para mantener su estmago (ncleo) msculos fuertes. Hacer ejercicios de estiramiento o yoga prenatal puede ser til. Pregntele a su mdico qu ejercicios son seguros para usted y su beb.  Un ejercicio til Weyerhaeuser Companyconsiste en colocar las manos y las rodillas en el suelo, la reduccin de su Turkmenistancabeza, y empujando su parte trasera en el aire.  Evite movimientos bruscos. Cambie de posicin lentamente (como de pie o sentado) para evitar movimientos bruscos que puedan causar el estiramiento y Chief Technology Officerel dolor.  Flexiona tus caderas. Doble y flexione sus caderas antes de toser, estornudar, rer o para evitar tirar de los ligamentos.  Aplicar calor. Una almohadilla elctrica o un bao caliente pueden ser tiles. Pregntele a su mdico si esto est bien. El calor extremo puede resultar peligroso para el beb.  Usted debe tratar de modificar su nivel de actividad diaria y evitar posiciones que pueden empeorar la condicin.  Cuando llamar al mdico / partera  Siempre informe a su mdico o partera acerca de cualquier tipo de dolor que tiene Academic librariandurante el embarazo. Dolor del ligamento redondo es rpido y  no dura mucho tiempo.  Llame a su mdico de inmediato si usted tiene:  dolor severo fiebre escalofros dolor al Geographical information systems officerorinar dificultad para Metallurgistcaminar  Dolor de vientre durante el embarazo puede ser debido a muchas causas diferentes. Es importante que su mdico para Sales promotion account executivedescartar condiciones ms graves, como las complicaciones del [place]embarazo, como desprendimiento de placenta o no con Firefighterel embarazo enfermedades tales como:  hernia inguinal apendicitis problemas de Crown Pointestmago, el  hgado y los riones Dolores de parto prematuro a veces puede confundirse con el dolor del ligamento redondo.

## 2013-10-30 NOTE — MAU Provider Note (Signed)
Chief Complaint: Back Pain and Abdominal Pain   First Provider Initiated Contact with Patient 10/30/13 0603     SUBJECTIVE HPI: Lindsay Gutierrez is a 28 y.o. G5P1031 at 8472w4d by LMP who presents to maternity admissions reporting sharp RLQ pain and right lower back pain x2 hours.  She reports the back pain is constant but the abdominal pain comes and goes with movement and walking. She reports good fetal movement, denies LOF, vaginal bleeding, vaginal itching/burning/discharge, urinary symptoms, h/a, dizziness, n/v, or fever/chills.     Past Medical History  Diagnosis Date  . Medical history non-contributory   . Other ectopic pregnancy without intrauterine pregnancy 10/27/2012   History reviewed. No pertinent past surgical history. History   Social History  . Marital Status: Single    Spouse Name: N/A    Number of Children: N/A  . Years of Education: N/A   Occupational History  . Not on file.   Social History Main Topics  . Smoking status: Never Smoker   . Smokeless tobacco: Never Used  . Alcohol Use: No  . Drug Use: No  . Sexual Activity: Yes    Birth Control/ Protection: None   Other Topics Concern  . Not on file   Social History Narrative  . No narrative on file   No current facility-administered medications on file prior to encounter.   Current Outpatient Prescriptions on File Prior to Encounter  Medication Sig Dispense Refill  . Prenatal Vit-Fe Fumarate-FA (PRENATAL MULTIVITAMIN) TABS Take 1 tablet by mouth daily at 12 noon.       No Known Allergies  ROS: Pertinent items in HPI  OBJECTIVE Blood pressure 106/71, pulse 81, resp. rate 18, height 5\' 1"  (1.549 m), weight 58.968 kg (130 lb), last menstrual period 06/15/2013, SpO2 100.00%. GENERAL: Well-developed, well-nourished female in no acute distress.  HEENT: Normocephalic HEART: normal rate RESP: normal effort ABDOMEN: Soft, non-tender, no rebound tenderness, no guarding EXTREMITIES: Nontender, no edema NEURO:  Alert and oriented  Dilation: Closed Cervical Position: Posterior Station: -3 Exam by:: Sharen CounterLisa Leftwich Kirby CNM  FHT 153 by doppler  LAB RESULTS Results for orders placed during the hospital encounter of 10/30/13 (from the past 24 hour(s))  URINALYSIS, ROUTINE W REFLEX MICROSCOPIC     Status: Abnormal   Collection Time    10/30/13  5:40 AM      Result Value Ref Range   Color, Urine YELLOW  YELLOW   APPearance CLEAR  CLEAR   Specific Gravity, Urine 1.010  1.005 - 1.030   pH 7.0  5.0 - 8.0   Glucose, UA NEGATIVE  NEGATIVE mg/dL   Hgb urine dipstick NEGATIVE  NEGATIVE   Bilirubin Urine NEGATIVE  NEGATIVE   Ketones, ur NEGATIVE  NEGATIVE mg/dL   Protein, ur NEGATIVE  NEGATIVE mg/dL   Urobilinogen, UA 0.2  0.0 - 1.0 mg/dL   Nitrite NEGATIVE  NEGATIVE   Leukocytes, UA SMALL (*) NEGATIVE  URINE MICROSCOPIC-ADD ON     Status: Abnormal   Collection Time    10/30/13  5:40 AM      Result Value Ref Range   Squamous Epithelial / LPF MANY (*) RARE   WBC, UA 7-10  <3 WBC/hpf   Bacteria, UA FEW (*) RARE  CBC     Status: Abnormal   Collection Time    10/30/13  6:13 AM      Result Value Ref Range   WBC 13.7 (*) 4.0 - 10.5 K/uL   RBC 3.28 (*) 3.87 - 5.11  MIL/uL   Hemoglobin 10.2 (*) 12.0 - 15.0 g/dL   HCT 16.130.5 (*) 09.636.0 - 04.546.0 %   MCV 93.0  78.0 - 100.0 fL   MCH 31.1  26.0 - 34.0 pg   MCHC 33.4  30.0 - 36.0 g/dL   RDW 40.913.7  81.111.5 - 91.415.5 %   Platelets 271  150 - 400 K/uL    ASSESSMENT 1. UTI (urinary tract infection) during pregnancy   2. Round ligament pain     PLAN Discharge home Urine sent for culture Keflex QID x7 days Rest/ice/heat/Tylenol for round ligament pain    Medication List         cephALEXin 500 MG capsule  Commonly known as:  KEFLEX  Take 1 capsule (500 mg total) by mouth 4 (four) times daily.     prenatal multivitamin Tabs tablet  Take 1 tablet by mouth daily at 12 noon.       Follow-up Information   [facility]. (As  scheduled on Tuesday.  )    Specialty:  Obstetrics and Gynecology   Contact information:   [address] [place] [address] [phone]      [facility] with [facility]. (As needed for emergencies)    Contact information:   [id number] [place] [address] [phone]      Sharen CounterLisa Leftwich-Kirby Certified Nurse-Midwife 10/30/2013  6:24 AM

## 2013-10-30 NOTE — MAU Note (Signed)
Patient reports RLQ abdominal pain and right sided back pain. Has not taken any pain meds. Denies vaginal bleeding, LOF and urinary symptoms.

## 2013-10-31 LAB — URINE CULTURE: Colony Count: 50000

## 2013-11-01 ENCOUNTER — Ambulatory Visit (INDEPENDENT_AMBULATORY_CARE_PROVIDER_SITE_OTHER): Payer: Medicaid Other | Admitting: Obstetrics & Gynecology

## 2013-11-01 VITALS — BP 105/61 | HR 110 | Temp 97.4°F | Wt 127.7 lb

## 2013-11-01 DIAGNOSIS — O4442 Low lying placenta NOS or without hemorrhage, second trimester: Secondary | ICD-10-CM

## 2013-11-01 DIAGNOSIS — O4400 Placenta previa specified as without hemorrhage, unspecified trimester: Secondary | ICD-10-CM

## 2013-11-01 LAB — POCT URINALYSIS DIP (DEVICE)
BILIRUBIN URINE: NEGATIVE
GLUCOSE, UA: NEGATIVE mg/dL
HGB URINE DIPSTICK: NEGATIVE
Ketones, ur: NEGATIVE mg/dL
LEUKOCYTES UA: NEGATIVE
NITRITE: NEGATIVE
Protein, ur: NEGATIVE mg/dL
Specific Gravity, Urine: 1.02 (ref 1.005–1.030)
Urobilinogen, UA: 0.2 mg/dL (ref 0.0–1.0)
pH: 7.5 (ref 5.0–8.0)

## 2013-11-01 NOTE — Progress Notes (Signed)
Routine visit. Good FM. No problems. She saw MFM last Friday and has another u/s this Friday with them. They plan to do an u/s every week.

## 2013-11-01 NOTE — Progress Notes (Signed)
Pt recently in MAU for UTI

## 2013-11-04 ENCOUNTER — Ambulatory Visit (HOSPITAL_COMMUNITY)
Admission: RE | Admit: 2013-11-04 | Discharge: 2013-11-04 | Disposition: A | Discharge status: Home / Self Care | Payer: Medicaid Other | Source: Ambulatory Visit | Attending: Obstetrics & Gynecology | Admitting: Obstetrics & Gynecology

## 2013-11-04 DIAGNOSIS — O358XX0 Maternal care for other (suspected) fetal abnormality and damage, not applicable or unspecified: Secondary | ICD-10-CM | POA: Insufficient documentation

## 2013-11-04 DIAGNOSIS — Z3689 Encounter for other specified antenatal screening: Secondary | ICD-10-CM | POA: Insufficient documentation

## 2013-11-09 ENCOUNTER — Encounter: Payer: Self-pay | Admitting: *Deleted

## 2013-11-11 ENCOUNTER — Ambulatory Visit (HOSPITAL_COMMUNITY)
Admission: RE | Admit: 2013-11-11 | Discharge: 2013-11-11 | Disposition: A | Discharge status: Home / Self Care | Payer: Medicaid Other | Source: Ambulatory Visit | Attending: Obstetrics & Gynecology | Admitting: Obstetrics & Gynecology

## 2013-11-11 ENCOUNTER — Other Ambulatory Visit: Payer: Self-pay | Admitting: Obstetrics & Gynecology

## 2013-11-11 VITALS — BP 100/60 | HR 83 | Wt 133.5 lb

## 2013-11-11 DIAGNOSIS — O358XX0 Maternal care for other (suspected) fetal abnormality and damage, not applicable or unspecified: Secondary | ICD-10-CM

## 2013-11-11 DIAGNOSIS — Z3689 Encounter for other specified antenatal screening: Secondary | ICD-10-CM | POA: Insufficient documentation

## 2013-11-11 DIAGNOSIS — O4442 Low lying placenta NOS or without hemorrhage, second trimester: Secondary | ICD-10-CM

## 2013-11-11 DIAGNOSIS — Z603 Acculturation difficulty: Secondary | ICD-10-CM

## 2013-11-11 DIAGNOSIS — Z789 Other specified health status: Secondary | ICD-10-CM

## 2013-11-11 DIAGNOSIS — O09292 Supervision of pregnancy with other poor reproductive or obstetric history, second trimester: Secondary | ICD-10-CM

## 2013-11-16 ENCOUNTER — Other Ambulatory Visit: Payer: Self-pay | Admitting: Obstetrics & Gynecology

## 2013-11-16 DIAGNOSIS — O283 Abnormal ultrasonic finding on antenatal screening of mother: Secondary | ICD-10-CM

## 2013-11-18 ENCOUNTER — Ambulatory Visit (HOSPITAL_COMMUNITY): Payer: Medicaid Other

## 2013-11-22 ENCOUNTER — Ambulatory Visit (INDEPENDENT_AMBULATORY_CARE_PROVIDER_SITE_OTHER): Payer: Medicaid Other | Admitting: Obstetrics & Gynecology

## 2013-11-22 VITALS — BP 107/65 | HR 91 | Temp 97.6°F | Wt 132.8 lb

## 2013-11-22 DIAGNOSIS — Z789 Other specified health status: Secondary | ICD-10-CM

## 2013-11-22 DIAGNOSIS — O358XX0 Maternal care for other (suspected) fetal abnormality and damage, not applicable or unspecified: Secondary | ICD-10-CM

## 2013-11-22 DIAGNOSIS — Z609 Problem related to social environment, unspecified: Secondary | ICD-10-CM

## 2013-11-22 DIAGNOSIS — O09299 Supervision of pregnancy with other poor reproductive or obstetric history, unspecified trimester: Secondary | ICD-10-CM

## 2013-11-22 DIAGNOSIS — O4442 Low lying placenta NOS or without hemorrhage, second trimester: Secondary | ICD-10-CM

## 2013-11-22 DIAGNOSIS — O4400 Placenta previa specified as without hemorrhage, unspecified trimester: Secondary | ICD-10-CM

## 2013-11-22 DIAGNOSIS — O09292 Supervision of pregnancy with other poor reproductive or obstetric history, second trimester: Secondary | ICD-10-CM

## 2013-11-22 LAB — POCT URINALYSIS DIP (DEVICE)
Bilirubin Urine: NEGATIVE
Glucose, UA: NEGATIVE mg/dL
Hgb urine dipstick: NEGATIVE
KETONES UR: NEGATIVE mg/dL
Nitrite: NEGATIVE
PH: 7 (ref 5.0–8.0)
Protein, ur: NEGATIVE mg/dL
SPECIFIC GRAVITY, URINE: 1.02 (ref 1.005–1.030)
Urobilinogen, UA: 0.2 mg/dL (ref 0.0–1.0)

## 2013-11-22 MED ORDER — COMPLETENATE 29-1 MG PO CHEW: 1.0000 | CHEWABLE_TABLET | Freq: Every day | ORAL | Status: DC

## 2013-11-22 NOTE — Progress Notes (Signed)
Patient requests PNV RX.

## 2013-11-22 NOTE — Patient Instructions (Signed)
Regrese a la clinica cuando tenga su cita. Si tiene problemas o preguntas, llama a la clinica o vaya a la sala de emergencia al [facility].    

## 2013-11-22 NOTE — Progress Notes (Signed)
Patient is Spanish-speaking only, Spanish interpreter present for this encounter. Patient is being seen weekly by MFM to follow CPAM.  Less mediastinal shift noted on 11/11/13 scan; ECHO scheduled for this Friday. No other complaints or concerns.  Fetal movement and labor precautions reviewed.

## 2013-11-25 ENCOUNTER — Other Ambulatory Visit: Payer: Self-pay | Admitting: Obstetrics & Gynecology

## 2013-11-25 ENCOUNTER — Ambulatory Visit (HOSPITAL_COMMUNITY)
Admission: RE | Admit: 2013-11-25 | Discharge: 2013-11-25 | Disposition: A | Discharge status: Home / Self Care | Payer: Self-pay | Source: Ambulatory Visit | Attending: Obstetrics & Gynecology | Admitting: Obstetrics & Gynecology

## 2013-11-25 VITALS — BP 99/66 | HR 88 | Wt 134.5 lb

## 2013-11-25 DIAGNOSIS — O283 Abnormal ultrasonic finding on antenatal screening of mother: Secondary | ICD-10-CM

## 2013-11-25 DIAGNOSIS — O289 Unspecified abnormal findings on antenatal screening of mother: Secondary | ICD-10-CM | POA: Insufficient documentation

## 2013-11-25 DIAGNOSIS — O4442 Low lying placenta NOS or without hemorrhage, second trimester: Secondary | ICD-10-CM

## 2013-11-25 DIAGNOSIS — Z3689 Encounter for other specified antenatal screening: Secondary | ICD-10-CM | POA: Insufficient documentation

## 2013-11-25 DIAGNOSIS — Z789 Other specified health status: Secondary | ICD-10-CM

## 2013-11-25 DIAGNOSIS — O358XX0 Maternal care for other (suspected) fetal abnormality and damage, not applicable or unspecified: Secondary | ICD-10-CM

## 2013-11-25 DIAGNOSIS — O09292 Supervision of pregnancy with other poor reproductive or obstetric history, second trimester: Secondary | ICD-10-CM

## 2013-11-25 NOTE — Progress Notes (Signed)
[facility]  Indication: 28 yr old 495P1031 at 6454w2d with fetus with CPAM for follow up ultrasound.  Findings: 1. Single intrauterine pregnancy. 2. Estimated fetal weight is in the 52nd%. 3. Posterior placenta without evidence of previa. 4. Normal amniotic fluid volume. 5. Normal transabdominal cervical length. 6. Again seen is a right sided chest mass consistent with CPAM measuring 1.8x4x2.6cm. 7. The remainder of the limited anatomy survey is normal. 8. No evidence of hydrops or prehydropic changes.  Recommendations: 1. Appropriate fetal growth. 2. CPAM: - previously counseled - CVR is 0.479 today which confers low risk for hydrops - s/p betamethasone - continue weekly surveillance - normal echocardiogram - referral to pediatric surgery in the third trimester 3. Normal quad screen 4. Recommend fetal growth every 4 weeks  Lindsay FosterKristen Aerianna Losey, MD

## 2013-12-02 ENCOUNTER — Ambulatory Visit (HOSPITAL_COMMUNITY)
Admission: RE | Admit: 2013-12-02 | Discharge: 2013-12-02 | Disposition: A | Discharge status: Home / Self Care | Payer: Self-pay | Source: Ambulatory Visit | Attending: Obstetrics & Gynecology | Admitting: Obstetrics & Gynecology

## 2013-12-02 DIAGNOSIS — O283 Abnormal ultrasonic finding on antenatal screening of mother: Secondary | ICD-10-CM

## 2013-12-02 DIAGNOSIS — O289 Unspecified abnormal findings on antenatal screening of mother: Secondary | ICD-10-CM | POA: Insufficient documentation

## 2013-12-05 ENCOUNTER — Other Ambulatory Visit: Payer: Self-pay | Admitting: Obstetrics & Gynecology

## 2013-12-05 DIAGNOSIS — O358XX0 Maternal care for other (suspected) fetal abnormality and damage, not applicable or unspecified: Secondary | ICD-10-CM

## 2013-12-06 ENCOUNTER — Encounter: Payer: Self-pay | Admitting: Obstetrics & Gynecology

## 2013-12-09 ENCOUNTER — Ambulatory Visit (HOSPITAL_COMMUNITY)
Admission: RE | Admit: 2013-12-09 | Discharge: 2013-12-09 | Disposition: A | Discharge status: Home / Self Care | Payer: Self-pay | Source: Ambulatory Visit | Attending: Obstetrics & Gynecology | Admitting: Obstetrics & Gynecology

## 2013-12-09 ENCOUNTER — Encounter (HOSPITAL_COMMUNITY): Payer: Self-pay

## 2013-12-09 DIAGNOSIS — O358XX0 Maternal care for other (suspected) fetal abnormality and damage, not applicable or unspecified: Secondary | ICD-10-CM | POA: Insufficient documentation

## 2013-12-09 DIAGNOSIS — Z3689 Encounter for other specified antenatal screening: Secondary | ICD-10-CM | POA: Insufficient documentation

## 2013-12-13 ENCOUNTER — Encounter: Payer: Self-pay | Admitting: Obstetrics and Gynecology

## 2013-12-13 ENCOUNTER — Ambulatory Visit (INDEPENDENT_AMBULATORY_CARE_PROVIDER_SITE_OTHER): Payer: Self-pay | Admitting: Obstetrics and Gynecology

## 2013-12-13 VITALS — BP 99/62 | HR 86 | Temp 98.7°F | Wt 137.0 lb

## 2013-12-13 DIAGNOSIS — O4442 Low lying placenta NOS or without hemorrhage, second trimester: Secondary | ICD-10-CM

## 2013-12-13 DIAGNOSIS — Z789 Other specified health status: Secondary | ICD-10-CM

## 2013-12-13 DIAGNOSIS — O09292 Supervision of pregnancy with other poor reproductive or obstetric history, second trimester: Secondary | ICD-10-CM

## 2013-12-13 DIAGNOSIS — O09299 Supervision of pregnancy with other poor reproductive or obstetric history, unspecified trimester: Secondary | ICD-10-CM

## 2013-12-13 DIAGNOSIS — O4400 Placenta previa specified as without hemorrhage, unspecified trimester: Secondary | ICD-10-CM

## 2013-12-13 DIAGNOSIS — O358XX0 Maternal care for other (suspected) fetal abnormality and damage, not applicable or unspecified: Secondary | ICD-10-CM

## 2013-12-13 LAB — POCT URINALYSIS DIP (DEVICE)
Bilirubin Urine: NEGATIVE
Glucose, UA: NEGATIVE mg/dL
HGB URINE DIPSTICK: NEGATIVE
Ketones, ur: NEGATIVE mg/dL
Nitrite: NEGATIVE
PH: 8 (ref 5.0–8.0)
PROTEIN: NEGATIVE mg/dL
SPECIFIC GRAVITY, URINE: 1.02 (ref 1.005–1.030)
Urobilinogen, UA: 0.2 mg/dL (ref 0.0–1.0)

## 2013-12-13 NOTE — Progress Notes (Signed)
Pt reports having contractions for 30 mins this am and then they went away.

## 2013-12-13 NOTE — Progress Notes (Signed)
Patient is doing well without complaints. FM/PTL precautions reviewed. 6/26 US- No mediastinal shift, f/u scheduled for 7/10 with MFM

## 2013-12-15 ENCOUNTER — Other Ambulatory Visit: Payer: Self-pay | Admitting: Obstetrics & Gynecology

## 2013-12-15 DIAGNOSIS — O358XX0 Maternal care for other (suspected) fetal abnormality and damage, not applicable or unspecified: Secondary | ICD-10-CM

## 2013-12-23 ENCOUNTER — Encounter (HOSPITAL_COMMUNITY): Payer: Self-pay

## 2013-12-23 ENCOUNTER — Ambulatory Visit (HOSPITAL_COMMUNITY)
Admission: RE | Admit: 2013-12-23 | Discharge: 2013-12-23 | Disposition: A | Discharge status: Home / Self Care | Payer: Self-pay | Source: Ambulatory Visit | Attending: Obstetrics & Gynecology | Admitting: Obstetrics & Gynecology

## 2013-12-23 DIAGNOSIS — Z3689 Encounter for other specified antenatal screening: Secondary | ICD-10-CM | POA: Insufficient documentation

## 2013-12-23 DIAGNOSIS — O358XX0 Maternal care for other (suspected) fetal abnormality and damage, not applicable or unspecified: Secondary | ICD-10-CM | POA: Insufficient documentation

## 2013-12-27 ENCOUNTER — Other Ambulatory Visit: Payer: Self-pay | Admitting: Obstetrics & Gynecology

## 2013-12-27 DIAGNOSIS — O358XX0 Maternal care for other (suspected) fetal abnormality and damage, not applicable or unspecified: Secondary | ICD-10-CM

## 2013-12-29 ENCOUNTER — Ambulatory Visit (INDEPENDENT_AMBULATORY_CARE_PROVIDER_SITE_OTHER): Payer: Self-pay | Admitting: Obstetrics & Gynecology

## 2013-12-29 VITALS — BP 94/66 | HR 110 | Temp 97.6°F | Wt 140.2 lb

## 2013-12-29 DIAGNOSIS — O358XX0 Maternal care for other (suspected) fetal abnormality and damage, not applicable or unspecified: Secondary | ICD-10-CM

## 2013-12-29 DIAGNOSIS — O09292 Supervision of pregnancy with other poor reproductive or obstetric history, second trimester: Secondary | ICD-10-CM

## 2013-12-29 DIAGNOSIS — Z23 Encounter for immunization: Secondary | ICD-10-CM

## 2013-12-29 DIAGNOSIS — O09299 Supervision of pregnancy with other poor reproductive or obstetric history, unspecified trimester: Secondary | ICD-10-CM

## 2013-12-29 LAB — CBC
HEMATOCRIT: 28 % — AB (ref 36.0–46.0)
Hemoglobin: 9.8 g/dL — ABNORMAL LOW (ref 12.0–15.0)
MCH: 30.9 pg (ref 26.0–34.0)
MCHC: 35 g/dL (ref 30.0–36.0)
MCV: 88.3 fL (ref 78.0–100.0)
Platelets: 299 10*3/uL (ref 150–400)
RBC: 3.17 MIL/uL — ABNORMAL LOW (ref 3.87–5.11)
RDW: 13 % (ref 11.5–15.5)
WBC: 9.1 10*3/uL (ref 4.0–10.5)

## 2013-12-29 LAB — POCT URINALYSIS DIP (DEVICE)
Bilirubin Urine: NEGATIVE
Glucose, UA: NEGATIVE mg/dL
HGB URINE DIPSTICK: NEGATIVE
KETONES UR: NEGATIVE mg/dL
Nitrite: NEGATIVE
PROTEIN: NEGATIVE mg/dL
SPECIFIC GRAVITY, URINE: 1.015 (ref 1.005–1.030)
UROBILINOGEN UA: 0.2 mg/dL (ref 0.0–1.0)
pH: 7.5 (ref 5.0–8.0)

## 2013-12-29 MED ORDER — TETANUS-DIPHTH-ACELL PERTUSSIS 5-2.5-18.5 LF-MCG/0.5 IM SUSP: 0.5000 mL | Freq: Once | INTRAMUSCULAR | Status: DC

## 2013-12-29 MED ORDER — BETAMETHASONE SOD PHOS & ACET 6 (3-3) MG/ML IJ SUSP
12.0000 mg | INTRAMUSCULAR | Status: AC
  Administered 2013-12-29: 12 mg via INTRAMUSCULAR

## 2013-12-29 NOTE — Progress Notes (Signed)
Needs to repeat betamethasone today. 1 hr and Tdap.

## 2013-12-29 NOTE — Patient Instructions (Signed)
Tercer trimestre de embarazo (Third Trimester of Pregnancy) El tercer trimestre va desde la semana29 hasta la 42, desde el sptimo hasta el noveno mes, y es la poca en la que el feto crece ms rpidamente. Hacia el final del noveno mes, el feto mide alrededor de 20pulgadas (45cm) de largo y pesa entre 6 y 10 libras (2,700 y 4,500kg).  CAMBIOS EN EL ORGANISMO Su organismo atraviesa por muchos cambios durante el embarazo, y estos varan de una mujer a otra.   Seguir aumentando de peso. Es de esperar que aumente entre 25 y 35libras (11 y 16kg) hacia el final del embarazo.  Podrn aparecer las primeras estras en las caderas, el abdomen y las mamas.  Puede tener necesidad de orinar con ms frecuencia porque el feto baja hacia la pelvis y ejerce presin sobre la vejiga.  Debido al embarazo podr sentir acidez estomacal con frecuencia.  Puede estar estreida, ya que ciertas hormonas enlentecen los movimientos de los msculos que empujan los desechos a travs de los intestinos.  Pueden aparecer hemorroides o abultarse e hincharse las venas (venas varicosas).  Puede sentir dolor plvico debido al aumento de peso y a que las hormonas del embarazo relajan las articulaciones entre los huesos de la pelvis. El dolor de espalda puede ser consecuencia de la sobrecarga de los msculos que soportan la postura.  Tal vez haya cambios en el cabello que pueden incluir su engrosamiento, crecimiento rpido y cambios en la textura. Adems, a algunas mujeres se les cae el cabello durante o despus del embarazo, o tienen el cabello seco o fino. Lo ms probable es que el cabello se le normalice despus del nacimiento del beb.  Las mamas seguirn creciendo y le dolern. A veces, puede haber una secrecin amarilla de las mamas llamada calostro.  El ombligo puede salir hacia afuera.  Puede sentir que le falta el aire debido a que se expande el tero.  Puede notar que el feto "baja" o lo siente ms bajo, en el  abdomen.  Puede tener una prdida de secrecin mucosa con sangre. Esto suele ocurrir en el trmino de unos pocos das a una semana antes de que comience el trabajo de parto.  El cuello del tero se vuelve delgado y blando (se borra) cerca de la fecha de parto. QU DEBE ESPERAR EN LOS EXMENES PRENATALES  Le harn exmenes prenatales cada 2semanas hasta la semana36. A partir de ese momento le harn exmenes semanales. Durante una visita prenatal de rutina:  La pesarn para asegurarse de que usted y el feto estn creciendo normalmente.  Le tomarn la presin arterial.  Le medirn el abdomen para controlar el desarrollo del beb.  Se escucharn los latidos cardacos fetales.  Se evaluarn los resultados de los estudios solicitados en visitas anteriores.  Le revisarn el cuello del tero cuando est prxima la fecha de parto para controlar si este se ha borrado. Alrededor de la semana36, el mdico le revisar el cuello del tero. Al mismo tiempo, realizar un anlisis de las secreciones del tejido vaginal. Este examen es para determinar si hay un tipo de bacteria, estreptococo Grupo B. El mdico le explicar esto con ms detalle. El mdico puede preguntarle lo siguiente:  Cmo le gustara que fuera el parto.  Cmo se siente.  Si siente los movimientos del beb.  Si ha tenido sntomas anormales, como prdida de lquido, sangrado, dolores de cabeza intensos o clicos abdominales.  Si tiene alguna pregunta. Otros exmenes o estudios de deteccin que pueden realizarse   durante el tercer trimestre incluyen lo siguiente:  Anlisis de sangre para controlar las concentraciones de hierro (anemia).  Controles fetales para determinar su salud, nivel de actividad y crecimiento. Si tiene alguna enfermedad o hay problemas durante el embarazo, le harn estudios. FALSO TRABAJO DE PARTO Es posible que sienta contracciones leves e irregulares que finalmente desaparecen. Se llaman contracciones de  Braxton Hicks o falso trabajo de parto. Las contracciones pueden durar horas, das o incluso semanas, antes de que el verdadero trabajo de parto se inicie. Si las contracciones ocurren a intervalos regulares, se intensifican o se hacen dolorosas, lo mejor es que la revise el mdico.  SIGNOS DE TRABAJO DE PARTO   Clicos de tipo menstrual.  Contracciones cada 5minutos o menos.  Contracciones que comienzan en la parte superior del tero y se extienden hacia abajo, a la zona inferior del abdomen y la espalda.  Sensacin de mayor presin en la pelvis o dolor de espalda.  Una secrecin de mucosidad acuosa o con sangre que sale de la vagina. Si tiene alguno de estos signos antes de la semana37 del embarazo, llame a su mdico de inmediato. Debe concurrir al hospital para que la controlen inmediatamente. INSTRUCCIONES PARA EL CUIDADO EN EL HOGAR   Evite fumar, consumir hierbas, beber alcohol y tomar frmacos que no le hayan recetado. Estas sustancias qumicas afectan la formacin y el desarrollo del beb.  Siga las indicaciones del mdico en relacin con el uso de medicamentos. Durante el embarazo, hay medicamentos que son seguros de tomar y otros que no.  Haga actividad fsica solo en la forma indicada por el mdico. Sentir clicos uterinos es un buen signo para detener la actividad fsica.  Contine comiendo alimentos que sanos con regularidad.  Use un sostn que le brinde buen soporte si le duelen las mamas.  No se d baos de inmersin en agua caliente, baos turcos ni saunas.  Colquese el cinturn de seguridad cuando conduzca.  No coma carne cruda ni queso sin cocinar; evite el contacto con las bandejas sanitarias de los gatos y la tierra que estos animales usan. Estos elementos contienen grmenes que pueden causar defectos congnitos en el beb.  Tome las vitaminas prenatales.  Si est estreida, pruebe un laxante suave (si el mdico lo autoriza). Consuma ms alimentos ricos en  fibra, como vegetales y frutas frescos y cereales integrales. Beba gran cantidad de lquido para mantener la orina de tono claro o color amarillo plido.  Dese baos de asiento con agua tibia para aliviar el dolor o las molestias causadas por las hemorroides. Use una crema para las hemorroides si el mdico la autoriza.  Si tiene venas varicosas, use medias de descanso. Eleve los pies durante 15minutos, 3 o 4veces por da. Limite la cantidad de sal en su dieta.  Evite levantar objetos pesados, use zapatos de tacones bajos y mantenga una buena postura.  Descanse con las piernas elevadas si tiene calambres o dolor de cintura.  Visite a su dentista si no lo ha hecho durante el embarazo. Use un cepillo de dientes blando para higienizarse los dientes y psese el hilo dental con suavidad.  Puede seguir manteniendo relaciones sexuales, a menos que el mdico le indique lo contrario.  No haga viajes largos excepto que sea absolutamente necesario y solo con la autorizacin del mdico.  Tome clases prenatales para entender, practicar y hacer preguntas sobre el trabajo de parto y el parto.  Haga un ensayo de la partida al hospital.  Prepare el bolso que   llevar al hospital.  Prepare la habitacin del beb.  Concurra a todas las visitas prenatales segn las indicaciones de su mdico. SOLICITE ATENCIN MDICA SI:  No est segura de que est en trabajo de parto o de que ha roto la bolsa de las aguas.  Tiene mareos.  Siente clicos leves, presin en la pelvis o dolor persistente en el abdomen.  Tiene nuseas, vmitos o diarrea persistentes.  Tiene secrecin vaginal con mal olor.  Siente dolor al orinar. SOLICITE ATENCIN MDICA DE INMEDIATO SI:   Tiene fiebre.  Tiene una prdida de lquido por la vagina.  Tiene sangrado o pequeas prdidas vaginales.  Siente dolor intenso o clicos en el abdomen.  Sube o baja de peso rpidamente.  Tiene dificultad para respirar y siente dolor de  pecho.  Sbitamente se le hinchan mucho el rostro, las manos, los tobillos, los pies o las piernas.  No ha sentido los movimientos del beb durante una hora.  Siente un dolor de cabeza intenso que no se alivia con medicamentos.  Hay cambios en la visin. Document Released: 03/12/2005 Document Revised: 06/07/2013 Exit[employer] Patient Information 2015 [employer]. This information is not intended to replace advice given to you by your health care provider. Make sure you discuss any questions you have with your health care provider.  

## 2013-12-29 NOTE — Progress Notes (Signed)
Betamethasone injection give at 1130 on July 16.  Informed patient to come to clinic at 1115 on Friday July 17.  Pt states she may not be able to come before the clinic closes due to work, informed patient go to MAU if after 12:15.  Charge nurse notified on MAU.

## 2013-12-29 NOTE — Progress Notes (Signed)
28 week labs today. Patient would like tdap.

## 2013-12-30 ENCOUNTER — Inpatient Hospital Stay (HOSPITAL_COMMUNITY)
Admission: AD | Admit: 2013-12-30 | Discharge: 2013-12-30 | Disposition: A | Discharge status: Home / Self Care | Payer: Self-pay | Source: Ambulatory Visit | Attending: Obstetrics & Gynecology | Admitting: Obstetrics & Gynecology

## 2013-12-30 DIAGNOSIS — O4700 False labor before 37 completed weeks of gestation, unspecified trimester: Secondary | ICD-10-CM | POA: Insufficient documentation

## 2013-12-30 LAB — GLUCOSE TOLERANCE, 1 HOUR (50G) W/O FASTING: Glucose, 1 Hour GTT: 93 mg/dL (ref 70–140)

## 2013-12-30 LAB — HIV ANTIBODY (ROUTINE TESTING W REFLEX): HIV 1&2 Ab, 4th Generation: NONREACTIVE

## 2013-12-30 LAB — RPR

## 2013-12-30 MED ORDER — BETAMETHASONE SOD PHOS & ACET 6 (3-3) MG/ML IJ SUSP
12.0000 mg | Freq: Once | INTRAMUSCULAR | Status: AC
  Administered 2013-12-30: 12 mg via INTRAMUSCULAR
  Filled 2013-12-30: qty 2

## 2013-12-30 NOTE — MAU Note (Signed)
hee for 2nd steroid injection. No c/o., no problems with shot yesterday.

## 2014-01-13 ENCOUNTER — Encounter (HOSPITAL_COMMUNITY): Payer: Self-pay

## 2014-01-13 ENCOUNTER — Ambulatory Visit (HOSPITAL_COMMUNITY)
Admission: RE | Admit: 2014-01-13 | Discharge: 2014-01-13 | Disposition: A | Discharge status: Home / Self Care | Payer: Self-pay | Source: Ambulatory Visit | Attending: Family | Admitting: Family

## 2014-01-13 VITALS — BP 105/68 | HR 91 | Wt 142.5 lb

## 2014-01-13 DIAGNOSIS — O358XX0 Maternal care for other (suspected) fetal abnormality and damage, not applicable or unspecified: Secondary | ICD-10-CM | POA: Insufficient documentation

## 2014-01-13 DIAGNOSIS — Z3689 Encounter for other specified antenatal screening: Secondary | ICD-10-CM | POA: Insufficient documentation

## 2014-01-19 ENCOUNTER — Ambulatory Visit (INDEPENDENT_AMBULATORY_CARE_PROVIDER_SITE_OTHER): Payer: Self-pay | Admitting: Obstetrics & Gynecology

## 2014-01-19 ENCOUNTER — Other Ambulatory Visit: Payer: Self-pay | Admitting: Obstetrics & Gynecology

## 2014-01-19 VITALS — BP 110/62 | HR 91 | Temp 98.3°F | Wt 142.0 lb

## 2014-01-19 DIAGNOSIS — O09299 Supervision of pregnancy with other poor reproductive or obstetric history, unspecified trimester: Secondary | ICD-10-CM

## 2014-01-19 DIAGNOSIS — O09292 Supervision of pregnancy with other poor reproductive or obstetric history, second trimester: Secondary | ICD-10-CM

## 2014-01-19 LAB — POCT URINALYSIS DIP (DEVICE)
Bilirubin Urine: NEGATIVE
Glucose, UA: NEGATIVE mg/dL
HGB URINE DIPSTICK: NEGATIVE
Ketones, ur: NEGATIVE mg/dL
NITRITE: NEGATIVE
PROTEIN: NEGATIVE mg/dL
SPECIFIC GRAVITY, URINE: 1.015 (ref 1.005–1.030)
UROBILINOGEN UA: 0.2 mg/dL (ref 0.0–1.0)
pH: 8.5 — ABNORMAL HIGH (ref 5.0–8.0)

## 2014-01-19 NOTE — Patient Instructions (Signed)
Tercer trimestre de embarazo (Third Trimester of Pregnancy) El tercer trimestre va desde la semana29 hasta la 42, desde el sptimo hasta el noveno mes, y es la poca en la que el feto crece ms rpidamente. Hacia el final del noveno mes, el feto mide alrededor de 20pulgadas (45cm) de largo y pesa entre 6 y 10 libras (2,700 y 4,500kg).  CAMBIOS EN EL ORGANISMO Su organismo atraviesa por muchos cambios durante el embarazo, y estos varan de una mujer a otra.   Seguir aumentando de peso. Es de esperar que aumente entre 25 y 35libras (11 y 16kg) hacia el final del embarazo.  Podrn aparecer las primeras estras en las caderas, el abdomen y las mamas.  Puede tener necesidad de orinar con ms frecuencia porque el feto baja hacia la pelvis y ejerce presin sobre la vejiga.  Debido al embarazo podr sentir acidez estomacal con frecuencia.  Puede estar estreida, ya que ciertas hormonas enlentecen los movimientos de los msculos que empujan los desechos a travs de los intestinos.  Pueden aparecer hemorroides o abultarse e hincharse las venas (venas varicosas).  Puede sentir dolor plvico debido al aumento de peso y a que las hormonas del embarazo relajan las articulaciones entre los huesos de la pelvis. El dolor de espalda puede ser consecuencia de la sobrecarga de los msculos que soportan la postura.  Tal vez haya cambios en el cabello que pueden incluir su engrosamiento, crecimiento rpido y cambios en la textura. Adems, a algunas mujeres se les cae el cabello durante o despus del embarazo, o tienen el cabello seco o fino. Lo ms probable es que el cabello se le normalice despus del nacimiento del beb.  Las mamas seguirn creciendo y le dolern. A veces, puede haber una secrecin amarilla de las mamas llamada calostro.  El ombligo puede salir hacia afuera.  Puede sentir que le falta el aire debido a que se expande el tero.  Puede notar que el feto "baja" o lo siente ms bajo, en el  abdomen.  Puede tener una prdida de secrecin mucosa con sangre. Esto suele ocurrir en el trmino de unos pocos das a una semana antes de que comience el trabajo de parto.  El cuello del tero se vuelve delgado y blando (se borra) cerca de la fecha de parto. QU DEBE ESPERAR EN LOS EXMENES PRENATALES  Le harn exmenes prenatales cada 2semanas hasta la semana36. A partir de ese momento le harn exmenes semanales. Durante una visita prenatal de rutina:  La pesarn para asegurarse de que usted y el feto estn creciendo normalmente.  Le tomarn la presin arterial.  Le medirn el abdomen para controlar el desarrollo del beb.  Se escucharn los latidos cardacos fetales.  Se evaluarn los resultados de los estudios solicitados en visitas anteriores.  Le revisarn el cuello del tero cuando est prxima la fecha de parto para controlar si este se ha borrado. Alrededor de la semana36, el mdico le revisar el cuello del tero. Al mismo tiempo, realizar un anlisis de las secreciones del tejido vaginal. Este examen es para determinar si hay un tipo de bacteria, estreptococo Grupo B. El mdico le explicar esto con ms detalle. El mdico puede preguntarle lo siguiente:  Cmo le gustara que fuera el parto.  Cmo se siente.  Si siente los movimientos del beb.  Si ha tenido sntomas anormales, como prdida de lquido, sangrado, dolores de cabeza intensos o clicos abdominales.  Si tiene alguna pregunta. Otros exmenes o estudios de deteccin que pueden realizarse   durante el tercer trimestre incluyen lo siguiente:  Anlisis de sangre para controlar las concentraciones de hierro (anemia).  Controles fetales para determinar su salud, nivel de actividad y crecimiento. Si tiene alguna enfermedad o hay problemas durante el embarazo, le harn estudios. FALSO TRABAJO DE PARTO Es posible que sienta contracciones leves e irregulares que finalmente desaparecen. Se llaman contracciones de  Braxton Hicks o falso trabajo de parto. Las contracciones pueden durar horas, das o incluso semanas, antes de que el verdadero trabajo de parto se inicie. Si las contracciones ocurren a intervalos regulares, se intensifican o se hacen dolorosas, lo mejor es que la revise el mdico.  SIGNOS DE TRABAJO DE PARTO   Clicos de tipo menstrual.  Contracciones cada 5minutos o menos.  Contracciones que comienzan en la parte superior del tero y se extienden hacia abajo, a la zona inferior del abdomen y la espalda.  Sensacin de mayor presin en la pelvis o dolor de espalda.  Una secrecin de mucosidad acuosa o con sangre que sale de la vagina. Si tiene alguno de estos signos antes de la semana37 del embarazo, llame a su mdico de inmediato. Debe concurrir al hospital para que la controlen inmediatamente. INSTRUCCIONES PARA EL CUIDADO EN EL HOGAR   Evite fumar, consumir hierbas, beber alcohol y tomar frmacos que no le hayan recetado. Estas sustancias qumicas afectan la formacin y el desarrollo del beb.  Siga las indicaciones del mdico en relacin con el uso de medicamentos. Durante el embarazo, hay medicamentos que son seguros de tomar y otros que no.  Haga actividad fsica solo en la forma indicada por el mdico. Sentir clicos uterinos es un buen signo para detener la actividad fsica.  Contine comiendo alimentos que sanos con regularidad.  Use un sostn que le brinde buen soporte si le duelen las mamas.  No se d baos de inmersin en agua caliente, baos turcos ni saunas.  Colquese el cinturn de seguridad cuando conduzca.  No coma carne cruda ni queso sin cocinar; evite el contacto con las bandejas sanitarias de los gatos y la tierra que estos animales usan. Estos elementos contienen grmenes que pueden causar defectos congnitos en el beb.  Tome las vitaminas prenatales.  Si est estreida, pruebe un laxante suave (si el mdico lo autoriza). Consuma ms alimentos ricos en  fibra, como vegetales y frutas frescos y cereales integrales. Beba gran cantidad de lquido para mantener la orina de tono claro o color amarillo plido.  Dese baos de asiento con agua tibia para aliviar el dolor o las molestias causadas por las hemorroides. Use una crema para las hemorroides si el mdico la autoriza.  Si tiene venas varicosas, use medias de descanso. Eleve los pies durante 15minutos, 3 o 4veces por da. Limite la cantidad de sal en su dieta.  Evite levantar objetos pesados, use zapatos de tacones bajos y mantenga una buena postura.  Descanse con las piernas elevadas si tiene calambres o dolor de cintura.  Visite a su dentista si no lo ha hecho durante el embarazo. Use un cepillo de dientes blando para higienizarse los dientes y psese el hilo dental con suavidad.  Puede seguir manteniendo relaciones sexuales, a menos que el mdico le indique lo contrario.  No haga viajes largos excepto que sea absolutamente necesario y solo con la autorizacin del mdico.  Tome clases prenatales para entender, practicar y hacer preguntas sobre el trabajo de parto y el parto.  Haga un ensayo de la partida al hospital.  Prepare el bolso que   llevar al hospital.  Prepare la habitacin del beb.  Concurra a todas las visitas prenatales segn las indicaciones de su mdico. SOLICITE ATENCIN MDICA SI:  No est segura de que est en trabajo de parto o de que ha roto la bolsa de las aguas.  Tiene mareos.  Siente clicos leves, presin en la pelvis o dolor persistente en el abdomen.  Tiene nuseas, vmitos o diarrea persistentes.  Tiene secrecin vaginal con mal olor.  Siente dolor al orinar. SOLICITE ATENCIN MDICA DE INMEDIATO SI:   Tiene fiebre.  Tiene una prdida de lquido por la vagina.  Tiene sangrado o pequeas prdidas vaginales.  Siente dolor intenso o clicos en el abdomen.  Sube o baja de peso rpidamente.  Tiene dificultad para respirar y siente dolor de  pecho.  Sbitamente se le hinchan mucho el rostro, las manos, los tobillos, los pies o las piernas.  No ha sentido los movimientos del beb durante una hora.  Siente un dolor de cabeza intenso que no se alivia con medicamentos.  Hay cambios en la visin. Document Released: 03/12/2005 Document Revised: 06/07/2013 Exit[employer] Patient Information 2015 [employer]. This information is not intended to replace advice given to you by your health care provider. Make sure you discuss any questions you have with your health care provider.  

## 2014-01-19 NOTE — Progress Notes (Signed)
C/o of left side lower back pain that radiates down left leg-- reports it started 3 days ago and is only when she goes to sit down and then stand up.

## 2014-01-19 NOTE — Progress Notes (Signed)
Pt has appt with [facility]FM Received 2nd course of BMZ Begin antenatal testing at 32 weeks

## 2014-01-26 ENCOUNTER — Ambulatory Visit (INDEPENDENT_AMBULATORY_CARE_PROVIDER_SITE_OTHER): Payer: Self-pay | Admitting: Obstetrics & Gynecology

## 2014-01-26 VITALS — BP 96/60 | HR 97 | Temp 98.0°F | Wt 143.2 lb

## 2014-01-26 DIAGNOSIS — O358XX0 Maternal care for other (suspected) fetal abnormality and damage, not applicable or unspecified: Secondary | ICD-10-CM

## 2014-01-26 LAB — POCT URINALYSIS DIP (DEVICE)
Bilirubin Urine: NEGATIVE
GLUCOSE, UA: NEGATIVE mg/dL
Hgb urine dipstick: NEGATIVE
Ketones, ur: NEGATIVE mg/dL
NITRITE: NEGATIVE
PH: 6.5 (ref 5.0–8.0)
Protein, ur: NEGATIVE mg/dL
Specific Gravity, Urine: 1.015 (ref 1.005–1.030)
UROBILINOGEN UA: 0.2 mg/dL (ref 0.0–1.0)

## 2014-01-26 NOTE — Progress Notes (Signed)
Start NST 2/week. US f/u tomorrow

## 2014-01-26 NOTE — Progress Notes (Signed)
Pt continues to experience pain down left leg

## 2014-01-26 NOTE — Patient Instructions (Signed)
Tercer trimestre de embarazo (Third Trimester of Pregnancy) El tercer trimestre va desde la semana29 hasta la 42, desde el sptimo hasta el noveno mes, y es la poca en la que el feto crece ms rpidamente. Hacia el final del noveno mes, el feto mide alrededor de 20pulgadas (45cm) de largo y pesa entre 6 y 10 libras (2,700 y 4,500kg).  CAMBIOS EN EL ORGANISMO Su organismo atraviesa por muchos cambios durante el embarazo, y estos varan de una mujer a otra.   Seguir aumentando de peso. Es de esperar que aumente entre 25 y 35libras (11 y 16kg) hacia el final del embarazo.  Podrn aparecer las primeras estras en las caderas, el abdomen y las mamas.  Puede tener necesidad de orinar con ms frecuencia porque el feto baja hacia la pelvis y ejerce presin sobre la vejiga.  Debido al embarazo podr sentir acidez estomacal con frecuencia.  Puede estar estreida, ya que ciertas hormonas enlentecen los movimientos de los msculos que empujan los desechos a travs de los intestinos.  Pueden aparecer hemorroides o abultarse e hincharse las venas (venas varicosas).  Puede sentir dolor plvico debido al aumento de peso y a que las hormonas del embarazo relajan las articulaciones entre los huesos de la pelvis. El dolor de espalda puede ser consecuencia de la sobrecarga de los msculos que soportan la postura.  Tal vez haya cambios en el cabello que pueden incluir su engrosamiento, crecimiento rpido y cambios en la textura. Adems, a algunas mujeres se les cae el cabello durante o despus del embarazo, o tienen el cabello seco o fino. Lo ms probable es que el cabello se le normalice despus del nacimiento del beb.  Las mamas seguirn creciendo y le dolern. A veces, puede haber una secrecin amarilla de las mamas llamada calostro.  El ombligo puede salir hacia afuera.  Puede sentir que le falta el aire debido a que se expande el tero.  Puede notar que el feto "baja" o lo siente ms bajo, en el  abdomen.  Puede tener una prdida de secrecin mucosa con sangre. Esto suele ocurrir en el trmino de unos pocos das a una semana antes de que comience el trabajo de parto.  El cuello del tero se vuelve delgado y blando (se borra) cerca de la fecha de parto. QU DEBE ESPERAR EN LOS EXMENES PRENATALES  Le harn exmenes prenatales cada 2semanas hasta la semana36. A partir de ese momento le harn exmenes semanales. Durante una visita prenatal de rutina:  La pesarn para asegurarse de que usted y el feto estn creciendo normalmente.  Le tomarn la presin arterial.  Le medirn el abdomen para controlar el desarrollo del beb.  Se escucharn los latidos cardacos fetales.  Se evaluarn los resultados de los estudios solicitados en visitas anteriores.  Le revisarn el cuello del tero cuando est prxima la fecha de parto para controlar si este se ha borrado. Alrededor de la semana36, el mdico le revisar el cuello del tero. Al mismo tiempo, realizar un anlisis de las secreciones del tejido vaginal. Este examen es para determinar si hay un tipo de bacteria, estreptococo Grupo B. El mdico le explicar esto con ms detalle. El mdico puede preguntarle lo siguiente:  Cmo le gustara que fuera el parto.  Cmo se siente.  Si siente los movimientos del beb.  Si ha tenido sntomas anormales, como prdida de lquido, sangrado, dolores de cabeza intensos o clicos abdominales.  Si tiene alguna pregunta. Otros exmenes o estudios de deteccin que pueden realizarse   durante el tercer trimestre incluyen lo siguiente:  Anlisis de sangre para controlar las concentraciones de hierro (anemia).  Controles fetales para determinar su salud, nivel de actividad y crecimiento. Si tiene alguna enfermedad o hay problemas durante el embarazo, le harn estudios. FALSO TRABAJO DE PARTO Es posible que sienta contracciones leves e irregulares que finalmente desaparecen. Se llaman contracciones de  Braxton Hicks o falso trabajo de parto. Las contracciones pueden durar horas, das o incluso semanas, antes de que el verdadero trabajo de parto se inicie. Si las contracciones ocurren a intervalos regulares, se intensifican o se hacen dolorosas, lo mejor es que la revise el mdico.  SIGNOS DE TRABAJO DE PARTO   Clicos de tipo menstrual.  Contracciones cada 5minutos o menos.  Contracciones que comienzan en la parte superior del tero y se extienden hacia abajo, a la zona inferior del abdomen y la espalda.  Sensacin de mayor presin en la pelvis o dolor de espalda.  Una secrecin de mucosidad acuosa o con sangre que sale de la vagina. Si tiene alguno de estos signos antes de la semana37 del embarazo, llame a su mdico de inmediato. Debe concurrir al hospital para que la controlen inmediatamente. INSTRUCCIONES PARA EL CUIDADO EN EL HOGAR   Evite fumar, consumir hierbas, beber alcohol y tomar frmacos que no le hayan recetado. Estas sustancias qumicas afectan la formacin y el desarrollo del beb.  Siga las indicaciones del mdico en relacin con el uso de medicamentos. Durante el embarazo, hay medicamentos que son seguros de tomar y otros que no.  Haga actividad fsica solo en la forma indicada por el mdico. Sentir clicos uterinos es un buen signo para detener la actividad fsica.  Contine comiendo alimentos que sanos con regularidad.  Use un sostn que le brinde buen soporte si le duelen las mamas.  No se d baos de inmersin en agua caliente, baos turcos ni saunas.  Colquese el cinturn de seguridad cuando conduzca.  No coma carne cruda ni queso sin cocinar; evite el contacto con las bandejas sanitarias de los gatos y la tierra que estos animales usan. Estos elementos contienen grmenes que pueden causar defectos congnitos en el beb.  Tome las vitaminas prenatales.  Si est estreida, pruebe un laxante suave (si el mdico lo autoriza). Consuma ms alimentos ricos en  fibra, como vegetales y frutas frescos y cereales integrales. Beba gran cantidad de lquido para mantener la orina de tono claro o color amarillo plido.  Dese baos de asiento con agua tibia para aliviar el dolor o las molestias causadas por las hemorroides. Use una crema para las hemorroides si el mdico la autoriza.  Si tiene venas varicosas, use medias de descanso. Eleve los pies durante 15minutos, 3 o 4veces por da. Limite la cantidad de sal en su dieta.  Evite levantar objetos pesados, use zapatos de tacones bajos y mantenga una buena postura.  Descanse con las piernas elevadas si tiene calambres o dolor de cintura.  Visite a su dentista si no lo ha hecho durante el embarazo. Use un cepillo de dientes blando para higienizarse los dientes y psese el hilo dental con suavidad.  Puede seguir manteniendo relaciones sexuales, a menos que el mdico le indique lo contrario.  No haga viajes largos excepto que sea absolutamente necesario y solo con la autorizacin del mdico.  Tome clases prenatales para entender, practicar y hacer preguntas sobre el trabajo de parto y el parto.  Haga un ensayo de la partida al hospital.  Prepare el bolso que   llevar al hospital.  Prepare la habitacin del beb.  Concurra a todas las visitas prenatales segn las indicaciones de su mdico. SOLICITE ATENCIN MDICA SI:  No est segura de que est en trabajo de parto o de que ha roto la bolsa de las aguas.  Tiene mareos.  Siente clicos leves, presin en la pelvis o dolor persistente en el abdomen.  Tiene nuseas, vmitos o diarrea persistentes.  Tiene secrecin vaginal con mal olor.  Siente dolor al orinar. SOLICITE ATENCIN MDICA DE INMEDIATO SI:   Tiene fiebre.  Tiene una prdida de lquido por la vagina.  Tiene sangrado o pequeas prdidas vaginales.  Siente dolor intenso o clicos en el abdomen.  Sube o baja de peso rpidamente.  Tiene dificultad para respirar y siente dolor de  pecho.  Sbitamente se le hinchan mucho el rostro, las manos, los tobillos, los pies o las piernas.  No ha sentido los movimientos del beb durante una hora.  Siente un dolor de cabeza intenso que no se alivia con medicamentos.  Hay cambios en la visin. Document Released: 03/12/2005 Document Revised: 06/07/2013 Exit[employer] Patient Information 2015 [employer]. This information is not intended to replace advice given to you by your health care provider. Make sure you discuss any questions you have with your health care provider.  

## 2014-01-27 ENCOUNTER — Encounter (HOSPITAL_COMMUNITY): Payer: Self-pay

## 2014-01-27 ENCOUNTER — Ambulatory Visit (HOSPITAL_COMMUNITY)
Admission: RE | Admit: 2014-01-27 | Discharge: 2014-01-27 | Disposition: A | Discharge status: Home / Self Care | Payer: Self-pay | Source: Ambulatory Visit | Attending: Family | Admitting: Family

## 2014-01-27 DIAGNOSIS — Z3689 Encounter for other specified antenatal screening: Secondary | ICD-10-CM | POA: Insufficient documentation

## 2014-01-27 DIAGNOSIS — O358XX0 Maternal care for other (suspected) fetal abnormality and damage, not applicable or unspecified: Secondary | ICD-10-CM | POA: Insufficient documentation

## 2014-01-31 ENCOUNTER — Encounter: Payer: Self-pay | Admitting: Obstetrics and Gynecology

## 2014-01-31 ENCOUNTER — Ambulatory Visit (INDEPENDENT_AMBULATORY_CARE_PROVIDER_SITE_OTHER): Payer: Self-pay | Admitting: Obstetrics and Gynecology

## 2014-01-31 VITALS — BP 96/60 | HR 82 | Temp 98.3°F | Wt 144.2 lb

## 2014-01-31 DIAGNOSIS — O358XX0 Maternal care for other (suspected) fetal abnormality and damage, not applicable or unspecified: Secondary | ICD-10-CM

## 2014-01-31 DIAGNOSIS — O09292 Supervision of pregnancy with other poor reproductive or obstetric history, second trimester: Secondary | ICD-10-CM

## 2014-01-31 DIAGNOSIS — Z789 Other specified health status: Secondary | ICD-10-CM

## 2014-01-31 DIAGNOSIS — O09299 Supervision of pregnancy with other poor reproductive or obstetric history, unspecified trimester: Secondary | ICD-10-CM

## 2014-01-31 LAB — POCT URINALYSIS DIP (DEVICE)
Bilirubin Urine: NEGATIVE
Glucose, UA: NEGATIVE mg/dL
Hgb urine dipstick: NEGATIVE
Ketones, ur: NEGATIVE mg/dL
NITRITE: NEGATIVE
PROTEIN: NEGATIVE mg/dL
Specific Gravity, Urine: 1.015 (ref 1.005–1.030)
Urobilinogen, UA: 0.2 mg/dL (ref 0.0–1.0)
pH: 7.5 (ref 5.0–8.0)

## 2014-01-31 NOTE — Progress Notes (Signed)
Interpreter present for visit. Peds surgery consult appt today @ [facility] [place] - pt states she is not aware of this appt.  BPP @ [facility] on 8/21 and weekly.

## 2014-01-31 NOTE — Progress Notes (Signed)
Patient is doing well without complaints. She is scheduled to meet with peds surgery today ar 3:30. F/U ultrasound on 8/21 FM/PTL precautions reviewed

## 2014-01-31 NOTE — Progress Notes (Signed)
NST/OB visit, pt has no complaints today.

## 2014-02-03 ENCOUNTER — Ambulatory Visit (HOSPITAL_COMMUNITY)
Admission: RE | Admit: 2014-02-03 | Discharge: 2014-02-03 | Disposition: A | Discharge status: Home / Self Care | Payer: Self-pay | Source: Ambulatory Visit | Attending: Family | Admitting: Family

## 2014-02-03 ENCOUNTER — Encounter (HOSPITAL_COMMUNITY): Payer: Self-pay

## 2014-02-03 DIAGNOSIS — O358XX0 Maternal care for other (suspected) fetal abnormality and damage, not applicable or unspecified: Secondary | ICD-10-CM | POA: Insufficient documentation

## 2014-02-03 DIAGNOSIS — Z3689 Encounter for other specified antenatal screening: Secondary | ICD-10-CM | POA: Insufficient documentation

## 2014-02-07 ENCOUNTER — Ambulatory Visit (INDEPENDENT_AMBULATORY_CARE_PROVIDER_SITE_OTHER): Payer: Self-pay | Admitting: Family

## 2014-02-07 VITALS — BP 107/67 | HR 81 | Temp 97.8°F | Wt 146.3 lb

## 2014-02-07 DIAGNOSIS — O4400 Placenta previa specified as without hemorrhage, unspecified trimester: Secondary | ICD-10-CM

## 2014-02-07 DIAGNOSIS — O4442 Low lying placenta NOS or without hemorrhage, second trimester: Secondary | ICD-10-CM

## 2014-02-07 DIAGNOSIS — O09292 Supervision of pregnancy with other poor reproductive or obstetric history, second trimester: Secondary | ICD-10-CM

## 2014-02-07 DIAGNOSIS — O358XX0 Maternal care for other (suspected) fetal abnormality and damage, not applicable or unspecified: Secondary | ICD-10-CM

## 2014-02-07 DIAGNOSIS — O09299 Supervision of pregnancy with other poor reproductive or obstetric history, unspecified trimester: Secondary | ICD-10-CM

## 2014-02-07 LAB — POCT URINALYSIS DIP (DEVICE)
Bilirubin Urine: NEGATIVE
Glucose, UA: NEGATIVE mg/dL
Hgb urine dipstick: NEGATIVE
Ketones, ur: NEGATIVE mg/dL
LEUKOCYTES UA: NEGATIVE
NITRITE: NEGATIVE
PROTEIN: NEGATIVE mg/dL
Specific Gravity, Urine: 1.015 (ref 1.005–1.030)
UROBILINOGEN UA: 0.2 mg/dL (ref 0.0–1.0)
pH: 7 (ref 5.0–8.0)

## 2014-02-07 NOTE — Progress Notes (Signed)
Attended surgical consult regarding fetal chest mass on 01/31/14.  Informed may be able to give birth at [facility] and plan of care will be based on size of mass at birth (report not in Epic yet).  No problems or concerns.   BPP on Friday, NST today.

## 2014-02-10 ENCOUNTER — Ambulatory Visit (HOSPITAL_COMMUNITY)
Admission: RE | Admit: 2014-02-10 | Discharge: 2014-02-10 | Disposition: A | Discharge status: Home / Self Care | Payer: Self-pay | Source: Ambulatory Visit | Attending: Family | Admitting: Family

## 2014-02-10 ENCOUNTER — Other Ambulatory Visit (HOSPITAL_COMMUNITY): Payer: Self-pay | Admitting: Maternal and Fetal Medicine

## 2014-02-10 DIAGNOSIS — O358XX0 Maternal care for other (suspected) fetal abnormality and damage, not applicable or unspecified: Secondary | ICD-10-CM

## 2014-02-10 DIAGNOSIS — Z3689 Encounter for other specified antenatal screening: Secondary | ICD-10-CM | POA: Insufficient documentation

## 2014-02-13 ENCOUNTER — Other Ambulatory Visit: Payer: Self-pay | Admitting: Family

## 2014-02-13 DIAGNOSIS — O358XX0 Maternal care for other (suspected) fetal abnormality and damage, not applicable or unspecified: Secondary | ICD-10-CM

## 2014-02-14 ENCOUNTER — Ambulatory Visit (INDEPENDENT_AMBULATORY_CARE_PROVIDER_SITE_OTHER): Payer: Self-pay | Admitting: *Deleted

## 2014-02-14 VITALS — BP 92/59 | HR 115

## 2014-02-14 DIAGNOSIS — O358XX0 Maternal care for other (suspected) fetal abnormality and damage, not applicable or unspecified: Secondary | ICD-10-CM

## 2014-02-15 NOTE — Progress Notes (Addendum)
Reactive strip on 02/14/14.

## 2014-02-17 ENCOUNTER — Ambulatory Visit (HOSPITAL_COMMUNITY)
Admission: RE | Admit: 2014-02-17 | Discharge: 2014-02-17 | Disposition: A | Discharge status: Home / Self Care | Payer: Self-pay | Source: Ambulatory Visit | Attending: Family | Admitting: Family

## 2014-02-17 ENCOUNTER — Encounter (HOSPITAL_COMMUNITY): Payer: Self-pay | Admitting: *Deleted

## 2014-02-17 ENCOUNTER — Encounter (HOSPITAL_COMMUNITY): Payer: Self-pay

## 2014-02-17 ENCOUNTER — Inpatient Hospital Stay (HOSPITAL_COMMUNITY)
Admission: AD | Admit: 2014-02-17 | Discharge: 2014-02-17 | Disposition: A | Discharge status: Home / Self Care | Payer: Self-pay | Source: Ambulatory Visit | Attending: Obstetrics & Gynecology | Admitting: Obstetrics & Gynecology

## 2014-02-17 DIAGNOSIS — O358XX0 Maternal care for other (suspected) fetal abnormality and damage, not applicable or unspecified: Secondary | ICD-10-CM | POA: Insufficient documentation

## 2014-02-17 DIAGNOSIS — O09292 Supervision of pregnancy with other poor reproductive or obstetric history, second trimester: Secondary | ICD-10-CM

## 2014-02-17 DIAGNOSIS — O4442 Low lying placenta NOS or without hemorrhage, second trimester: Secondary | ICD-10-CM

## 2014-02-17 DIAGNOSIS — Z789 Other specified health status: Secondary | ICD-10-CM

## 2014-02-17 DIAGNOSIS — O479 False labor, unspecified: Secondary | ICD-10-CM

## 2014-02-17 DIAGNOSIS — Z603 Acculturation difficulty: Secondary | ICD-10-CM

## 2014-02-17 DIAGNOSIS — Z3689 Encounter for other specified antenatal screening: Secondary | ICD-10-CM | POA: Insufficient documentation

## 2014-02-17 DIAGNOSIS — O4700 False labor before 37 completed weeks of gestation, unspecified trimester: Secondary | ICD-10-CM | POA: Insufficient documentation

## 2014-02-17 LAB — URINALYSIS, ROUTINE W REFLEX MICROSCOPIC
Bilirubin Urine: NEGATIVE
GLUCOSE, UA: NEGATIVE mg/dL
Hgb urine dipstick: NEGATIVE
Ketones, ur: NEGATIVE mg/dL
Leukocytes, UA: NEGATIVE
Nitrite: NEGATIVE
PH: 6 (ref 5.0–8.0)
Protein, ur: NEGATIVE mg/dL
Urobilinogen, UA: 0.2 mg/dL (ref 0.0–1.0)

## 2014-02-17 MED ORDER — ACETAMINOPHEN 500 MG PO TABS: 500.0000 mg | ORAL_TABLET | Freq: Four times a day (QID) | ORAL | Status: DC | PRN

## 2014-02-17 NOTE — Discharge Instructions (Signed)
Tercer trimestre del embarazo  (Third Trimester of Pregnancy)  El tercer trimestre del embarazo abarca desde la semana 29 hasta la semana 42, desde el 7 mes hasta el 9. En este trimestre el beb (feto) se desarrolla muy rpidamente. Hacia el final del noveno mes, el beb que an no ha nacido mide alrededor de 20 pulgadas (45 cm) de largo. Y pesa entre 6 y 10 libras (2,700 y 4,500 kg).  CUIDADOS EN EL HOGAR   Evite fumar, consumir hierbas y beber alcohol. Evite los frmacos que no apruebe el mdico.  Slo tome los medicamentos que le haya indicado su mdico. Algunos medicamentos son seguros para tomar durante el embarazo y otros no lo son.  Haga ejercicios slo como le indique el mdico. Deje de hacer ejercicios si comienza a tener clicos.  Haga comidas regulares y sanas.  Use un sostn que le brinde buen soporte si sus mamas estn sensibles.  No utilice la baera con agua caliente, baos turcos y saunas.  Colquese el cinturn de seguridad cuando conduzca.  Evite comer carne cruda y el contacto con los utensilios y desperdicios de los gatos.  Tome las vitaminas indicadas para la etapa prenatal.  Trate de tomar medicamentos para mover el intestino (laxantes) segn lo necesario y si su mdico la autoriza. Consuma ms fibra comiendo frutas y vegetales frescos y granos enteros. Beba gran cantidad de lquido para mantener el pis (orina) de tono claro o amarillo plido.  Tome baos de agua tibia (baos de asiento) para calmar el dolor o las molestias causadas por las hemorroides. Use una crema para las hemorroides si el mdico la autoriza.  Si tiene venas hinchadas y abultadas (venas varicosas), use medias de soporte. Eleve (levante) los pies durante 15 minutos, 3 o 4 veces por da. Limite el consumo de sal en su dieta.  Evite levantar objetos pesados, usar tacones altos y sintese derecha.  Descanse con las piernas elevadas si tiene calambres o dolor de cintura.  Visite a su dentista  si no lo ha hecho durante el embarazo. Use un cepillo de dientes blando para higienizarse los dientes. Use suavemente el hilo dental.  Puede tener sexo (relaciones sexuales) siempre que el mdico la autorice.  No viaje por largas distancias si puede evitarlo. Slo hgalo con la aprobacin de su mdico.  Haga el curso pre parto.  Practique conducir hasta el hospital.  Prepare el bolso que llevar.  Prepare la habitacin del beb.  Concurra a los controles mdicos. SOLICITE AYUDA SI:   No est segura si est en trabajo de parto o ha roto la bolsa de aguas.  Tiene mareos.  Siente clicos intensos o presin en la zona baja del vientre (abdomen).  Siente un dolor persistente en la zona del vientre.  Tiene malestar estomacal (nuseas), devuelve (vomita), o tiene deposiciones acuosas (diarrea).  Advierte un olor ftido que proviene de la vagina.  Siente dolor al hacer pis (orinar). SOLICITE AYUDA DE INMEDIATO SI:   Tiene fiebre.  Pierde lquido o sangre por la vagina.  Tiene sangrando o pequeas prdidas vaginales.  Siente dolor intenso o clicos en el abdomen.  Sube o baja de peso rpidamente.  Tiene dificultad para respirar o siente dolor en el pecho.  Sbitamente se le hinchan el rostro, las manos, los tobillos, los pies o las piernas.  No ha sentido los movimientos del beb durante una hora.  Siente un dolor de cabeza intenso que no se alivia con medicamentos.  Su visin se modifica. Document   Released: 02/02/2013 ExitCare Patient Information 2015 [employer]. This information is not intended to replace advice given to you by your health care provider. Make sure you discuss any questions you have with your health care provider.  

## 2014-02-17 NOTE — Progress Notes (Signed)
I asssisted the patient during admission paper work, and during examination with Levon Hedger, and Leonides Schanz MD. I also assisted her with discharge papers. By Orlan Leavens Spanish Interpreter

## 2014-02-17 NOTE — MAU Provider Note (Signed)
First Provider Initiated Contact with Patient 02/17/14 0444      Chief Complaint: Abdominal pain  Lindsay Gutierrez is  28 y.o. [phone] at [redacted]w[redacted]d presents complaining of contractions that started at 2am. The pain starts in her back and radiates to the front and occurs intermittently, however she cannot recall how often it occurs. She denies LOF, vaginal bleeding, or vaginal discharge. Good fetal movement.  No dysuria or urinary urgency. No vaginal pruritus.  No headache, RUQ pain, or scotomata.  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  Obstetrical/Gynecological History: OB History   Grav Para Term Preterm Abortions TAB SAB Ect Mult Living   Past Medical History: Past Medical History  Diagnosis Date  . Medical history non-contributory   . Other ectopic pregnancy without intrauterine pregnancy 10/27/2012    Past Surgical History: History reviewed. No pertinent past surgical history.  Family History: History reviewed. No pertinent family history.  Social History: History  Substance Use Topics  . Smoking status: Never Smoker   . Smokeless tobacco: Never Used  . Alcohol Use: No    Allergies: No Known Allergies  Meds:  Facility-administered medications prior to admission  Medication Dose Route Frequency Provider Last Rate Last Dose  . Tdap (BOOSTRIX) injection 0.5 mL  0.5 mL Intramuscular Once Adam Phenix, MD       Prescriptions prior to admission  Medication Sig Dispense Refill  . Prenatal Vit-Fe Fumarate-FA (PRENATAL MULTIVITAMIN) TABS Take 1 tablet by mouth daily at 12 noon.      . prenatal vitamin w/FE, FA (NATACHEW) 29-1 MG CHEW chewable tablet Chew 1 tablet by mouth daily at 12 noon.  30 tablet  6    Review of Systems -   Review of Systems  Constitutional: Negative for fever, chills, weight loss, malaise/fatigue and diaphoresis.  HENT: Negative for hearing loss, ear pain, nosebleeds, congestion,  sore throat, neck pain, tinnitus and ear discharge.   Eyes: Negative for blurred vision, double vision, photophobia, pain, discharge and redness.  Respiratory: Negative for cough, hemoptysis, sputum production, shortness of breath, wheezing and stridor.   Cardiovascular: Negative for chest pain, palpitations, orthopnea,  leg swelling  Gastrointestinal: Negative for heartburn, nausea, vomiting, diarrhea, constipation, blood in stool Genitourinary: Negative for dysuria, urgency, frequency, hematuria and flank pain.  Musculoskeletal: Negative for myalgias, back pain, joint pain and falls.  Skin: Negative for itching and rash.  Neurological: Negative for dizziness, tingling, tremors, sensory change, speech change, focal weakness, seizures, loss of consciousness, weakness and headaches.  Endo/Heme/Allergies: Negative for environmental allergies and polydipsia. Does not bruise/bleed easily.  Psychiatric/Behavioral: Negative for depression, suicidal ideas, hallucinations, memory loss and substance abuse. The patient is not nervous/anxious and does not have insomnia.      Physical Exam  Blood pressure 110/70, pulse 78, temperature 98.1 F (36.7 C), temperature source Oral, resp. rate 18, height  (1.575 m), weight 66.679 kg (147 lb), last menstrual period 06/15/2013. GENERAL: Well-developed, well-nourished female in no acute distress.  LUNGS: Clear to auscultation bilaterally.  HEART: Regular rate and rhythm. ABDOMEN: Soft, nontender, nondistended, gravid.  EXTREMITIES: Nontender, no edema, 2+ distal pulses. DTR's 2+ CERVICAL EXAM: 0/thick/-2 posterior  Presentation: Vertex FHT:  Baseline rate 135 bpm   Variability moderate  Accelerations present   Decelerations none Contractions: Every 2-4 mins   Labs: Results for orders placed during the hospital encounter of 02/17/14 (from the past 24  hour(s))  URINALYSIS, ROUTINE W REFLEX MICROSCOPIC   Collection Time    02/17/14  4:18 AM      Result  Value Ref Range   Color, Urine YELLOW  YELLOW   APPearance CLEAR  CLEAR   Specific Gravity, Urine <1.005 (*) 1.005 - 1.030   pH 6.0  5.0 - 8.0   Glucose, UA NEGATIVE  NEGATIVE mg/dL   Hgb urine dipstick NEGATIVE  NEGATIVE   Bilirubin Urine NEGATIVE  NEGATIVE   Ketones, ur NEGATIVE  NEGATIVE mg/dL   Protein, ur NEGATIVE  NEGATIVE mg/dL   Urobilinogen, UA 0.2  0.0 - 1.0 mg/dL   Nitrite NEGATIVE  NEGATIVE   Leukocytes, UA NEGATIVE  NEGATIVE   Imaging Studies:  US Ob Follow Up  02/10/2014   OBSTETRICAL ULTRASOUND: This exam was performed within a [facility]. The OB US report was generated in the AS system, and faxed to the ordering physician.   This report is available in the [employer]. See the AS Obstetric US report via the Image Link.  US Fetal Bpp W/o Non Stress  02/10/2014   OBSTETRICAL ULTRASOUND: This exam was performed within a [facility]. The OB US report was generated in the AS system, and faxed to the ordering physician.   This report is available in the [employer]. See the AS Obstetric US report via the Image Link.  US Fetal Bpp W/o Non Stress  02/03/2014   OBSTETRICAL ULTRASOUND: This exam was performed within a [facility]. The OB US report was generated in the AS system, and faxed to the ordering physician.   This report is available in the [employer]. See the AS Obstetric US report via the Image Link.  US Fetal Bpp W/o Non Stress  01/27/2014   OBSTETRICAL ULTRASOUND: This exam was performed within a [facility]. The OB US report was generated in the AS system, and faxed to the ordering physician.   This report is available in the [employer]. See the AS Obstetric US report via the Image Link.   Assessment: Lindsay Gutierrez is  28 y.o. [phone] at [redacted]w[redacted]d presents with uterine contractions on toco without cervical change.  No LOF or vaginal bleeding.  Not noted to be in labor. FHTs  reassuring.  Plan: - Discharge home with labor precautions/kick counts  - Tylenol PRN pain - Advised to continue regularly scheduled OB appts.  Discussed with Gala Murdoch, Crystal S 9/4/20155:32 AM

## 2014-02-21 ENCOUNTER — Ambulatory Visit (INDEPENDENT_AMBULATORY_CARE_PROVIDER_SITE_OTHER): Payer: Self-pay | Admitting: Advanced Practice Midwife

## 2014-02-21 VITALS — BP 93/69 | HR 100 | Temp 97.8°F | Wt 148.2 lb

## 2014-02-21 DIAGNOSIS — O358XX0 Maternal care for other (suspected) fetal abnormality and damage, not applicable or unspecified: Secondary | ICD-10-CM

## 2014-02-21 LAB — POCT URINALYSIS DIP (DEVICE)
Bilirubin Urine: NEGATIVE
Glucose, UA: NEGATIVE mg/dL
Hgb urine dipstick: NEGATIVE
KETONES UR: NEGATIVE mg/dL
Leukocytes, UA: NEGATIVE
Nitrite: NEGATIVE
Protein, ur: NEGATIVE mg/dL
SPECIFIC GRAVITY, URINE: 1.015 (ref 1.005–1.030)
Urobilinogen, UA: 0.2 mg/dL (ref 0.0–1.0)
pH: 6 (ref 5.0–8.0)

## 2014-02-21 NOTE — Progress Notes (Signed)
Doing well.  Good fetal movement, denies vaginal bleeding, LOF, regular contractions.  BPP with MFM changed to every other week per MFM.  Weekly NST/OB visit in clinic.  Discussed plans for baby at birth, MFM unsure whether baby will need surgery soon after birth, or wait 6 months, or no surgery at all.  Ok to deliver at [facility] per [facility]FM on 9/4.

## 2014-02-21 NOTE — Progress Notes (Signed)
Pt has weekly BPP @ [facility] on Fridays

## 2014-02-24 ENCOUNTER — Ambulatory Visit (HOSPITAL_COMMUNITY): Payer: Self-pay

## 2014-02-28 ENCOUNTER — Other Ambulatory Visit: Payer: Self-pay | Admitting: Obstetrics & Gynecology

## 2014-02-28 ENCOUNTER — Ambulatory Visit (INDEPENDENT_AMBULATORY_CARE_PROVIDER_SITE_OTHER): Payer: Self-pay | Admitting: Obstetrics & Gynecology

## 2014-02-28 VITALS — BP 96/65 | HR 120 | Wt 149.5 lb

## 2014-02-28 DIAGNOSIS — O358XX0 Maternal care for other (suspected) fetal abnormality and damage, not applicable or unspecified: Secondary | ICD-10-CM

## 2014-02-28 LAB — POCT URINALYSIS DIP (DEVICE)
Bilirubin Urine: NEGATIVE
GLUCOSE, UA: NEGATIVE mg/dL
HGB URINE DIPSTICK: NEGATIVE
KETONES UR: NEGATIVE mg/dL
Nitrite: NEGATIVE
Protein, ur: NEGATIVE mg/dL
SPECIFIC GRAVITY, URINE: 1.015 (ref 1.005–1.030)
Urobilinogen, UA: 0.2 mg/dL (ref 0.0–1.0)
pH: 6 (ref 5.0–8.0)

## 2014-02-28 LAB — OB RESULTS CONSOLE GBS: GBS: NEGATIVE

## 2014-02-28 NOTE — Progress Notes (Signed)
Pt with no complaints Cx and GBS obtained today Labor precautions reviewed NST reviewed and reactive.  Romaine Maciolek L. Harraway-Smith, M.D., [place]

## 2014-02-28 NOTE — Patient Instructions (Signed)
Infeccin por estreptococo del grupoB durante el embarazo (Group B Streptococcus Infection During Pregnancy) El estreptococo del grupo B (GBS, por sus siglas en ingls) es un tipo de bacteria que se encuentra en las mujeres sanas. No es el mismo que la bacteria que causa faringitis estreptoccica. Puede tener estreptococo del grupoB en la vagina, el recto o la vejiga. El estreptococo del grupoB no se transmite por contacto sexual; sin embargo, un beb puede contagiarse durante el parto, lo que puede ser peligroso para Engineer, maintenance (IT). El estreptococo del grupoB no es peligroso para usted y, generalmente, no provoca sntomas. El mdico puede hacerle anlisis de deteccin del estreptococo del grupoB entre la semana 35 y la 37de Psychiatrist. Esta bacteria es peligrosa solamente durante el parto, de modo que no es necesario hacer pruebas de deteccin con anterioridad. Es posible Nurse, children's del grupoB durante el embarazo y no transmitrselo nunca al beb. Si los resultados de los anlisis de deteccin del estreptococo del grupoB son positivos, el mdico puede recomendar que se le administren antibiticos durante el parto para asegurarse de que el beb est sano. FACTORES DE RIESGO Tiene ms probabilidades de transmitirle el estreptococo del grupoB al beb si:   Se le rompe la bolsa de aguas (ruptura de [place]) o si el trabajo de parto comienza antes de las 37semanas.  Se le rompe la bolsa 18horas antes del parto.  Transmiti el estreptococo del grupoB en un embarazo anterior.  Tiene una infeccin de las vas urinarias causada por el estreptococo del grupoB en cualquier momento durante el embarazo.  Tiene fiebre durante el [place]. SNTOMAS La mayora de las mujeres que tienen estreptococo del grupoB no presentan sntomas. Si tiene una infeccin de las vas urinarias causada por el estreptococo del grupoB, es posible que orine con frecuencia o que tenga dolor al Geographical information systems officer y [place].  Generalmente, los bebs que tienen estreptococo del grupoB presentan sntomas en el trmino de los 7das posteriores al nacimiento. Los sntomas pueden incluir:   Problemas respiratorios.  Problemas cardacos y de presin arterial.  Problemas digestivos y renales. DIAGNSTICO Se recomienda que todas las embarazadas se hagan exmenes de rutina para la deteccin del estreptococo del grupoB. El mdico toma [place] de secrecin de la vagina y el recto con un hisopo, que luego se enva al laboratorio para Landscape architect presencia de estreptococo del grupoB. Tambin pueden tomarle una muestra de orina para controlar si hay bacterias.  TRATAMIENTO Si el resultado de los [employer] de deteccin del estreptococo del grupoB es positivo, tal vez deba recibir tratamiento con un antibitico durante el [place] de [place]. En cuanto comience el trabajo de parto o se produzca la ruptura de las [place], recibir el antibitico a travs de una va [place]. Seguir recibiendo [employer] despus del [place]. No necesita antibiticos si el parto es por cesrea. Si el beb muestra signos o sntomas de estreptococo del grupoB despus del parto, tambin puede recibir tratamiento con antibiticos. INSTRUCCIONES PARA EL CUIDADO EN EL HOGAR   Tome todos los medicamentos tal como el [employer]. Tome los medicamentos solamente como se lo hayan indicado.  Contine con las visitas y cuidados prenatales.  Cumpla con todas las visitas de control. SOLICITE ATENCIN MDICA SI:   Siente dolor al [employer].  Tiene necesidad de orinar con frecuencia.  Tiene fiebre. SOLICITE ATENCIN MDICA DE INMEDIATO SI:   Se produce la ruptura de las [place].  Comienza el trabajo de [place]. Document Released: 11/19/2007 Document Revised: 06/07/2013  ExitCare Patient Information 2015 [place], [place]. This information is not intended to replace advice given to you by your health care provider. Make sure you discuss  any questions you have with your health care provider.

## 2014-02-28 NOTE — Progress Notes (Signed)
Pt has weekly visits @ [facility] for BPP on Fridays.  Pt declines flu vaccine today because she is slightly congested.  She would like to receive at her next visit.  VIS (Spanish) given today.  Interpreter- Laveda Norman present for visit.

## 2014-03-01 LAB — GC/CHLAMYDIA PROBE AMP
CT PROBE, AMP APTIMA: NEGATIVE
GC Probe RNA: NEGATIVE

## 2014-03-02 ENCOUNTER — Encounter: Payer: Self-pay | Admitting: Obstetrics & Gynecology

## 2014-03-02 LAB — CULTURE, BETA STREP (GROUP B ONLY)

## 2014-03-03 ENCOUNTER — Ambulatory Visit (HOSPITAL_COMMUNITY)
Admission: RE | Admit: 2014-03-03 | Discharge: 2014-03-03 | Disposition: A | Discharge status: Home / Self Care | Payer: Self-pay | Source: Ambulatory Visit | Attending: Family | Admitting: Family

## 2014-03-03 VITALS — BP 118/76 | HR 87 | Wt 151.8 lb

## 2014-03-03 DIAGNOSIS — Z3689 Encounter for other specified antenatal screening: Secondary | ICD-10-CM | POA: Insufficient documentation

## 2014-03-03 DIAGNOSIS — O358XX0 Maternal care for other (suspected) fetal abnormality and damage, not applicable or unspecified: Secondary | ICD-10-CM | POA: Insufficient documentation

## 2014-03-06 ENCOUNTER — Inpatient Hospital Stay (HOSPITAL_COMMUNITY)
Admission: AD | Admit: 2014-03-06 | Discharge: 2014-03-08 | DRG: 775 | Disposition: A | Discharge status: Home / Self Care | Payer: Medicaid Other | Source: Ambulatory Visit | Attending: Family Medicine | Admitting: Family Medicine

## 2014-03-06 ENCOUNTER — Encounter (HOSPITAL_COMMUNITY): Payer: Self-pay | Admitting: *Deleted

## 2014-03-06 DIAGNOSIS — O4442 Low lying placenta NOS or without hemorrhage, second trimester: Secondary | ICD-10-CM

## 2014-03-06 DIAGNOSIS — O09292 Supervision of pregnancy with other poor reproductive or obstetric history, second trimester: Secondary | ICD-10-CM

## 2014-03-06 DIAGNOSIS — O358XX0 Maternal care for other (suspected) fetal abnormality and damage, not applicable or unspecified: Secondary | ICD-10-CM

## 2014-03-06 DIAGNOSIS — Z789 Other specified health status: Secondary | ICD-10-CM

## 2014-03-06 NOTE — MAU Note (Signed)
Pt c/o contractions since 3am evey 5 mins. States some white discharge also. +FM. 1cm last monday

## 2014-03-07 ENCOUNTER — Other Ambulatory Visit: Payer: Self-pay

## 2014-03-07 ENCOUNTER — Encounter (HOSPITAL_COMMUNITY): Payer: Self-pay | Admitting: *Deleted

## 2014-03-07 ENCOUNTER — Encounter (HOSPITAL_COMMUNITY): Payer: Medicaid Other | Admitting: Anesthesiology

## 2014-03-07 ENCOUNTER — Inpatient Hospital Stay (HOSPITAL_COMMUNITY): Payer: Medicaid Other | Admitting: Anesthesiology

## 2014-03-07 DIAGNOSIS — O4400 Placenta previa specified as without hemorrhage, unspecified trimester: Secondary | ICD-10-CM

## 2014-03-07 DIAGNOSIS — O358XX0 Maternal care for other (suspected) fetal abnormality and damage, not applicable or unspecified: Secondary | ICD-10-CM

## 2014-03-07 DIAGNOSIS — O479 False labor, unspecified: Secondary | ICD-10-CM | POA: Diagnosis present

## 2014-03-07 LAB — RPR

## 2014-03-07 LAB — CBC
HCT: 32.6 % — ABNORMAL LOW (ref 36.0–46.0)
HEMOGLOBIN: 10.6 g/dL — AB (ref 12.0–15.0)
MCH: 28.8 pg (ref 26.0–34.0)
MCHC: 32.5 g/dL (ref 30.0–36.0)
MCV: 88.6 fL (ref 78.0–100.0)
Platelets: 297 10*3/uL (ref 150–400)
RBC: 3.68 MIL/uL — ABNORMAL LOW (ref 3.87–5.11)
RDW: 13.5 % (ref 11.5–15.5)
WBC: 10.8 10*3/uL — ABNORMAL HIGH (ref 4.0–10.5)

## 2014-03-07 LAB — TYPE AND SCREEN
ABO/RH(D): O POS
Antibody Screen: NEGATIVE

## 2014-03-07 LAB — HIV ANTIBODY (ROUTINE TESTING W REFLEX): HIV 1&2 Ab, 4th Generation: NONREACTIVE

## 2014-03-07 MED ORDER — WITCH HAZEL-GLYCERIN EX PADS: 1.0000 "application " | MEDICATED_PAD | CUTANEOUS | Status: DC | PRN

## 2014-03-07 MED ORDER — OXYTOCIN 40 UNITS IN LACTATED RINGERS INFUSION - SIMPLE MED
62.5000 mL/h | INTRAVENOUS | Status: DC
  Filled 2014-03-07: qty 1000

## 2014-03-07 MED ORDER — OXYTOCIN BOLUS FROM INFUSION: 500.0000 mL | INTRAVENOUS | Status: DC

## 2014-03-07 MED ORDER — SIMETHICONE 80 MG PO CHEW: 80.0000 mg | CHEWABLE_TABLET | ORAL | Status: DC | PRN

## 2014-03-07 MED ORDER — LIDOCAINE HCL (PF) 1 % IJ SOLN
INTRAMUSCULAR | Status: DC | PRN
  Administered 2014-03-07 (×2): 5 mL

## 2014-03-07 MED ORDER — OXYCODONE-ACETAMINOPHEN 5-325 MG PO TABS: 2.0000 | ORAL_TABLET | ORAL | Status: DC | PRN

## 2014-03-07 MED ORDER — EPHEDRINE 5 MG/ML INJ
10.0000 mg | INTRAVENOUS | Status: DC | PRN
  Filled 2014-03-07: qty 2

## 2014-03-07 MED ORDER — ONDANSETRON HCL 4 MG/2ML IJ SOLN: 4.0000 mg | INTRAMUSCULAR | Status: DC | PRN

## 2014-03-07 MED ORDER — EPHEDRINE 5 MG/ML INJ
10.0000 mg | INTRAVENOUS | Status: DC | PRN
Start: 2014-03-07 — End: 2014-03-07
  Filled 2014-03-07: qty 2

## 2014-03-07 MED ORDER — PHENYLEPHRINE 40 MCG/ML (10ML) SYRINGE FOR IV PUSH (FOR BLOOD PRESSURE SUPPORT)
80.0000 ug | PREFILLED_SYRINGE | INTRAVENOUS | Status: DC | PRN
  Filled 2014-03-07: qty 2

## 2014-03-07 MED ORDER — LACTATED RINGERS IV SOLN: INTRAVENOUS | Status: DC

## 2014-03-07 MED ORDER — ACETAMINOPHEN 325 MG PO TABS: 650.0000 mg | ORAL_TABLET | ORAL | Status: DC | PRN

## 2014-03-07 MED ORDER — DIBUCAINE 1 % RE OINT: 1.0000 "application " | TOPICAL_OINTMENT | RECTAL | Status: DC | PRN

## 2014-03-07 MED ORDER — FENTANYL 2.5 MCG/ML BUPIVACAINE 1/10 % EPIDURAL INFUSION (WH - ANES)
INTRAMUSCULAR | Status: AC
  Administered 2014-03-07: 14 mL/h via EPIDURAL
  Filled 2014-03-07: qty 125

## 2014-03-07 MED ORDER — ZOLPIDEM TARTRATE 5 MG PO TABS: 5.0000 mg | ORAL_TABLET | Freq: Every evening | ORAL | Status: DC | PRN

## 2014-03-07 MED ORDER — BUTORPHANOL TARTRATE 1 MG/ML IJ SOLN
1.0000 mg | INTRAMUSCULAR | Status: DC | PRN
  Filled 2014-03-07: qty 1

## 2014-03-07 MED ORDER — CITRIC ACID-SODIUM CITRATE 334-500 MG/5ML PO SOLN: 30.0000 mL | ORAL | Status: DC | PRN

## 2014-03-07 MED ORDER — INFLUENZA VAC SPLIT QUAD 0.5 ML IM SUSY
0.5000 mL | PREFILLED_SYRINGE | INTRAMUSCULAR | Status: AC
  Administered 2014-03-08: 0.5 mL via INTRAMUSCULAR
  Filled 2014-03-07: qty 0.5

## 2014-03-07 MED ORDER — FENTANYL 2.5 MCG/ML BUPIVACAINE 1/10 % EPIDURAL INFUSION (WH - ANES): 14.0000 mL/h | INTRAMUSCULAR | Status: DC | PRN

## 2014-03-07 MED ORDER — OXYCODONE-ACETAMINOPHEN 5-325 MG PO TABS: 1.0000 | ORAL_TABLET | ORAL | Status: DC | PRN

## 2014-03-07 MED ORDER — LANOLIN HYDROUS EX OINT: TOPICAL_OINTMENT | CUTANEOUS | Status: DC | PRN

## 2014-03-07 MED ORDER — BENZOCAINE-MENTHOL 20-0.5 % EX AERO
1.0000 "application " | INHALATION_SPRAY | CUTANEOUS | Status: DC | PRN
  Administered 2014-03-07: 1 via TOPICAL
  Filled 2014-03-07: qty 56

## 2014-03-07 MED ORDER — LACTATED RINGERS IV SOLN
500.0000 mL | Freq: Once | INTRAVENOUS | Status: DC
Start: 2014-03-07 — End: 2014-03-07

## 2014-03-07 MED ORDER — COMPLETENATE 29-1 MG PO CHEW
1.0000 | CHEWABLE_TABLET | Freq: Every day | ORAL | Status: DC
  Filled 2014-03-07: qty 1

## 2014-03-07 MED ORDER — PHENYLEPHRINE 40 MCG/ML (10ML) SYRINGE FOR IV PUSH (FOR BLOOD PRESSURE SUPPORT)
PREFILLED_SYRINGE | INTRAVENOUS | Status: AC
  Filled 2014-03-07: qty 10

## 2014-03-07 MED ORDER — IBUPROFEN 600 MG PO TABS
600.0000 mg | ORAL_TABLET | Freq: Four times a day (QID) | ORAL | Status: DC
  Administered 2014-03-07 – 2014-03-08 (×6): 600 mg via ORAL
  Filled 2014-03-07 (×6): qty 1

## 2014-03-07 MED ORDER — ONDANSETRON HCL 4 MG PO TABS: 4.0000 mg | ORAL_TABLET | ORAL | Status: DC | PRN

## 2014-03-07 MED ORDER — PRENATAL MULTIVITAMIN CH
1.0000 | ORAL_TABLET | Freq: Every day | ORAL | Status: DC
  Administered 2014-03-07 – 2014-03-08 (×2): 1 via ORAL
  Filled 2014-03-07 (×2): qty 1

## 2014-03-07 MED ORDER — TETANUS-DIPHTH-ACELL PERTUSSIS 5-2.5-18.5 LF-MCG/0.5 IM SUSP: 0.5000 mL | Freq: Once | INTRAMUSCULAR | Status: DC

## 2014-03-07 MED ORDER — DIPHENHYDRAMINE HCL 50 MG/ML IJ SOLN: 12.5000 mg | INTRAMUSCULAR | Status: DC | PRN

## 2014-03-07 MED ORDER — SENNOSIDES-DOCUSATE SODIUM 8.6-50 MG PO TABS
2.0000 | ORAL_TABLET | ORAL | Status: DC
  Administered 2014-03-08: 2 via ORAL
  Filled 2014-03-07: qty 2

## 2014-03-07 MED ORDER — LACTATED RINGERS IV SOLN: 500.0000 mL | INTRAVENOUS | Status: DC | PRN

## 2014-03-07 MED ORDER — ONDANSETRON HCL 4 MG/2ML IJ SOLN: 4.0000 mg | Freq: Four times a day (QID) | INTRAMUSCULAR | Status: DC | PRN

## 2014-03-07 MED ORDER — LIDOCAINE HCL (PF) 1 % IJ SOLN
30.0000 mL | INTRAMUSCULAR | Status: DC | PRN
  Filled 2014-03-07: qty 30

## 2014-03-07 MED ORDER — DIPHENHYDRAMINE HCL 25 MG PO CAPS: 25.0000 mg | ORAL_CAPSULE | Freq: Four times a day (QID) | ORAL | Status: DC | PRN

## 2014-03-07 NOTE — Anesthesia Procedure Notes (Signed)
Epidural Patient location during procedure: OB Start time: 03/07/2014 1:01 AM  Staffing Anesthesiologist: Brayton Caves Performed by: anesthesiologist   Preanesthetic Checklist Completed: patient identified, site marked, surgical consent, pre-op evaluation, timeout performed, IV checked, risks and benefits discussed and monitors and equipment checked  Epidural Patient position: sitting Prep: site prepped and draped and DuraPrep Patient monitoring: continuous pulse ox and blood pressure Approach: midline Location: L3-L4 Injection technique: LOR air  Needle:  Needle type: Tuohy  Needle gauge: 17 G Needle length: 9 cm and 9 Needle insertion depth: 5 cm cm Catheter type: closed end flexible Catheter size: 19 Gauge Catheter at skin depth: 10 cm Test dose: negative  Assessment Events: blood not aspirated, injection not painful, no injection resistance, negative IV test and no paresthesia  Additional Notes Patient identified.  Risk benefits discussed including failed block, incomplete pain control, headache, nerve damage, paralysis, blood pressure changes, nausea, vomiting, reactions to medication both toxic or allergic, and postpartum back pain.  Patient expressed understanding and wished to proceed.  All questions were answered.  Sterile technique used throughout procedure and epidural site dressed with sterile barrier dressing. No paresthesia or other complications noted.The patient did not experience any signs of intravascular injection such as tinnitus or metallic taste in mouth nor signs of intrathecal spread such as rapid motor block. Please see nursing notes for vital signs.

## 2014-03-07 NOTE — Progress Notes (Signed)
I assisted [employer] with a assessment  And questions. By Orlan Leavens

## 2014-03-07 NOTE — Progress Notes (Signed)
I assisted the patient in MAU with registration, and valoration  With [employer]. Patient was transfer to a room# 167 from MAU I assisted Ambulance person with some questions, Dr Brayton Caves with interpretation during Epidural Procedure By Orlan Leavens

## 2014-03-07 NOTE — Anesthesia Postprocedure Evaluation (Signed)
  Anesthesia Post-op Note  Patient: Lindsay Gutierrez  Procedure(s) Performed: * No procedures listed *  Patient Location: Mother/Baby  Anesthesia Type:Epidural  Level of Consciousness: awake, alert , oriented and patient cooperative  Airway and Oxygen Therapy: Patient Spontanous Breathing  Post-op Pain: none  Post-op Assessment: Post-op Vital signs reviewed, Patient's Cardiovascular Status Stable, Respiratory Function Stable, Patent Airway, No signs of Nausea or vomiting, Adequate PO intake, Pain level controlled, No headache, No backache, No residual numbness and No residual motor weakness  Post-op Vital Signs: Reviewed and stable  Last Vitals:  Filed Vitals:   03/07/14 0940  BP: 104/66  Pulse: 70  Temp: 36.9 C  Resp: 18    Complications: No apparent anesthesia complications

## 2014-03-07 NOTE — Lactation Note (Signed)
This note was copied from the chart of Lindsay Gutierrez. Lactation Consultation Note: Initial visit with mom. She reports pain with nursing. Baby sleepy at present.Unwrapped and she started rooting a little. Assisted with latch. Reviewed wide open mouth and keeping the baby close to the breast throughout the feeding. No questions at present. BF brochure given with resources for support after DC. To call for assist prn  Patient Name: Lindsay Gutierrez HQION'G Date: 03/07/2014 Reason for consult: Initial assessment   Maternal Data Formula Feeding for Exclusion: Yes Reason for exclusion: Mother's choice to formula and breast feed on admission Does the patient have breastfeeding experience prior to this delivery?: Yes  Feeding Feeding Type: Breast Fed Length of feed: 5 min  LATCH Score/Interventions Latch: Repeated attempts needed to sustain latch, nipple held in mouth throughout feeding, stimulation needed to elicit sucking reflex.  Audible Swallowing: None  Type of Nipple: Flat  Comfort (Breast/Nipple): Filling, red/small blisters or bruises, mild/mod discomfort  Problem noted: Mild/Moderate discomfort  Hold (Positioning): Assistance needed to correctly position infant at breast and maintain latch. Intervention(s): Breastfeeding basics reviewed  LATCH Score: 4  Lactation Tools Discussed/Used     Consult Status Consult Status: Follow-up Date: 03/08/14 Follow-up type: In-patient    Pamelia Hoit 03/07/2014, 1:47 PM

## 2014-03-07 NOTE — H&P (Signed)
LABOR ADMISSION HISTORY AND PHYSICAL  Lindsay Gutierrez is a 28 y.o. female [phone] with IUP at [redacted]w[redacted]d by LMP presenting for contractions. Pt presents w/ contractions since ~ 3am on 9/21, occuring every 5 min. She reports +FMs, No LOF, no VB, no blurry vision, headaches or peripheral edema, and RUQ pain. She desires IV pain medicines for labor pain control. She plans on breast and bottle feeding. She is undecided for birth control.  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 Medical history non-contributory   . Other ectopic pregnancy without intrauterine pregnancy 10/27/2012    Past Surgical History: History reviewed. No pertinent past surgical history.  Obstetrical History: OB History   Grav Para Term Preterm Abortions TAB SAB Ect Mult Living   Gynecological History: OB History   Grav Para Term Preterm Abortions TAB SAB Ect Mult Living   Social History: History   Social History  . Marital Status: Single    Spouse Name: N/A    Number of Children: N/A  . Years of Education: N/A   Social History Main Topics  . Smoking status: Never Smoker   . Smokeless tobacco: Never Used  . Alcohol Use: No  . Drug Use: No  . Sexual Activity: Yes    Birth Control/ Protection: None   Other Topics Concern  . None   Social History Narrative  . None    Family History: History reviewed. No pertinent family history.  Allergies: No Known Allergies  Prescriptions prior to admission  Medication Sig Dispense Refill  . acetaminophen (TYLENOL) 500 MG tablet Take 1 tablet (500 mg total) by mouth every 6 (six) hours as needed.  30 tablet  0  .  prenatal vitamin w/FE, FA (NATACHEW) 29-1 MG CHEW chewable tablet Chew 1 tablet by mouth daily at 12 noon.  30 tablet  6     Review of Systems   All systems reviewed and negative except as stated in HPI  Blood pressure 129/84, pulse 97, temperature 98.4 F (36.9 C), temperature source Oral, resp. rate 20, height 5' 1.5" (1.562 m), weight 155 lb (70.308 kg), last menstrual period 06/15/2013, SpO2 100.00%. General appearance: alert and moderate distress Lungs: clear to auscultation bilaterally Heart: regular rate and rhythm Abdomen: soft, non-tender; bowel sounds normal Pelvic: Adequate Extremities: Homans sign is negative, no sign of DVT Presentation: cephalic Fetal monitoringBaseline: 120 bpm, Variability: Good {> 6 bpm), Accelerations: Reactive and Decelerations: Absent Uterine activityDate/time of onset: 9/21 @ 3am, Frequency: Every 2-5 minutes, Duration: 60 seconds and Intensity: strong Dilation: 7 Effacement (%): 90 Station: +1 Exam by:: penley, rn    Prenatal labs: ABO, Rh: O/Positive/-- (04/06 0000) Antibody: Negative (04/06 0000) Rubella:   RPR: NON REAC (07/16 1442)  HBsAg: Negative (04/06 0000)  HIV: NONREACTIVE (07/16 1442)  GBS: Negative (09/15 0000)  1 hr Glucola 93 Genetic screening  Neg Quad Anatomy US - As above, + CPAM type III   Prenatal Transfer Tool  Maternal Diabetes: No Genetic Screening: Normal Maternal Ultrasounds/Referrals: Abnormal:  Findings:  Other: R fetal chest mass, CPAM type 3 Fetal Ultrasounds or other Referrals:  Fetal echo - normal Maternal Substance Abuse:  No Significant Maternal Medications:  None Significant Maternal Lab Results: None; GBS neg     Results for orders placed during the hospital encounter of 03/06/14 (from the past 24 hour(s))  CBC   Collection Time    03/07/14 12:31 AM      Result Value Ref Range   WBC 10.8 (*) 4.0 - 10.5 K/uL   RBC 3.68 (*) 3.87 - 5.11 MIL/uL   Hemoglobin 10.6 (*) 12.0 - 15.0 g/dL   HCT  40.9 (*) 81.1 - 46.0 %   MCV 88.6  78.0 - 100.0 fL   MCH 28.8  26.0 - 34.0 pg   MCHC 32.5  30.0 - 36.0 g/dL   RDW 91.4  78.2 - 95.6 %   Platelets 297  150 - 400 K/uL    Patient Active Problem List   Diagnosis Date Noted  . Normal delivery 03/07/2014  . Need for Tdap vaccination 12/29/2013  . Low-lying posterior placenta, antepartum 10/25/2013  . 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 Low birth weigh infant in prior pregnancy (5 lbs 6 oz at 39 weeks), currently pregnant 09/27/2013  . Language barrier, speaks Spanish only 09/27/2013    Assessment: Lindsay Gutierrez is a 28 y.o. [id number] at [redacted]w[redacted]d here for Contractions  28 y.o. @ at [redacted]w[redacted]d by LMP in active labor with Cat I strip.  #Labor: Expectant Management #Pain: Desires IV pain meds #FWB: Cat I, reassuring # Fetus w/ CPAM, type 3 - Normal fetal echo. Peds surgery to be involved later in life. BMZ  Given at 19 wks and 28 wks - NICU at delivery  #ID:  GBS neg #MOF: Breast + formula #MOC:Undecided #Circ:  It's a girl!  Lindsay Gutierrez 03/07/2014, 1:00 AM

## 2014-03-07 NOTE — Progress Notes (Signed)
UR chart review completed.  

## 2014-03-07 NOTE — Anesthesia Preprocedure Evaluation (Signed)

## 2014-03-08 NOTE — Discharge Instructions (Signed)
Vaginal Delivery, Care After °Refer to this sheet in the next few weeks. These discharge instructions provide you with information on caring for yourself after delivery. Your caregiver may also give you specific instructions. Your treatment has been planned according to the most current medical practices available, but problems sometimes occur. Call your caregiver if you have any problems or questions after you go home. °HOME CARE INSTRUCTIONS °· Take over-the-counter or prescription medicines only as directed by your caregiver or pharmacist. °· Do not drink alcohol, especially if you are breastfeeding or taking medicine to relieve pain. °· Do not chew or smoke tobacco. °· Do not use illegal drugs. °· Continue to use good perineal care. Good perineal care includes: °¨ Wiping your perineum from front to back. °¨ Keeping your perineum clean. °· Do not use tampons or douche until your caregiver says it is okay. °· Shower, wash your hair, and take tub baths as directed by your caregiver. °· Wear a well-fitting bra that provides breast support. °· Eat healthy foods. °· Drink enough fluids to keep your urine clear or pale yellow. °· Eat high-fiber foods such as whole grain cereals and breads, brown rice, beans, and fresh fruits and vegetables every day. These foods may help prevent or relieve constipation. °· Follow your caregiver's recommendations regarding resumption of activities such as climbing stairs, driving, lifting, exercising, or traveling. °· Talk to your caregiver about resuming sexual activities. Resumption of sexual activities is dependent upon your risk of infection, your rate of healing, and your comfort and desire to resume sexual activity. °· Try to have someone help you with your household activities and your newborn for at least a few days after you leave the hospital. °· Rest as much as possible. Try to rest or take a nap when your newborn is sleeping. °· Increase your activities gradually. °· Keep  all of your scheduled postpartum appointments. It is very important to keep your scheduled follow-up appointments. At these appointments, your caregiver will be checking to make sure that you are healing physically and emotionally. °SEEK MEDICAL CARE IF:  °· You are passing large clots from your vagina. Save any clots to show your caregiver. °· You have a foul smelling discharge from your vagina. °· You have trouble urinating. °· You are urinating frequently. °· You have pain when you urinate. °· You have a change in your bowel movements. °· You have increasing redness, pain, or swelling near your vaginal incision (episiotomy) or vaginal tear. °· You have pus draining from your episiotomy or vaginal tear. °· Your episiotomy or vaginal tear is separating. °· You have painful, hard, or reddened breasts. °· You have a severe headache. °· You have blurred vision or see spots. °· You feel sad or depressed. °· You have thoughts of hurting yourself or your newborn. °· You have questions about your care, the care of your newborn, or medicines. °· You are dizzy or light-headed. °· You have a rash. °· You have nausea or vomiting. °· You were breastfeeding and have not had a menstrual period within 12 weeks after you stopped breastfeeding. °· You are not breastfeeding and have not had a menstrual period by the 12th week after delivery. °· You have a fever. °SEEK IMMEDIATE MEDICAL CARE IF:  °· You have persistent pain. °· You have chest pain. °· You have shortness of breath. °· You faint. °· You have leg pain. °· You have stomach pain. °· Your vaginal bleeding saturates two or more sanitary pads   in 1 hour. MAKE SURE YOU:   Understand these instructions.  Will watch your condition.  Will get help right away if you are not doing well or get worse. Document Released: 05/30/2000 Document Revised: 10/17/2013 Document Reviewed: 01/28/2012 [facility] Patient Information 2015 [place], [place]. This information is not intended to  replace advice given to you by your health care provider. Make sure you discuss any questions you have with your health care provider. Intrauterine Device Information An intrauterine device (IUD) is inserted into your uterus to prevent pregnancy. There are two types of IUDs available:   Copper IUD--This type of IUD is wrapped in copper wire and is placed inside the uterus. Copper makes the uterus and fallopian tubes produce a fluid that kills sperm. The copper IUD can stay in place for 10 years.  Hormone IUD--This type of IUD contains the hormone progestin (synthetic progesterone). The hormone thickens the cervical mucus and prevents sperm from entering the uterus. It also thins the uterine lining to prevent implantation of a fertilized egg. The hormone can weaken or kill the sperm that get into the uterus. One type of hormone IUD can stay in place for 5 years, and another type can stay in place for 3 years. Your health care provider will make sure you are a good candidate for a contraceptive IUD. Discuss with your health care provider the possible side effects.  ADVANTAGES OF AN INTRAUTERINE DEVICE  IUDs are highly effective, reversible, long acting, and low maintenance.   There are no estrogen-related side effects.   An IUD can be used when breastfeeding.   IUDs are not associated with weight gain.   The copper IUD works immediately after insertion.   The hormone IUD works right away if inserted within 7 days of your period starting. You will need to use a backup method of birth control for 7 days if the hormone IUD is inserted at any other time in your cycle.  The copper IUD does not interfere with your female hormones.   The hormone IUD can make heavy menstrual periods lighter and decrease cramping.   The hormone IUD can be used for 3 or 5 years.   The copper IUD can be used for 10 years. DISADVANTAGES OF AN INTRAUTERINE DEVICE  The hormone IUD can be associated with  irregular bleeding patterns.   The copper IUD can make your menstrual flow heavier and more painful.   You may experience cramping and vaginal bleeding after insertion.  Document Released: 05/06/2004 Document Revised: 02/02/2013 Document Reviewed: 11/21/2012 [facility] Patient Information 2015 [place], [place]. This information is not intended to replace advice given to you by your health care provider. Make sure you discuss any questions you have with your health care provider.

## 2014-03-08 NOTE — Progress Notes (Signed)
I stopped and check on patient her needs, no questions at the time. Lindsay Gutierrez

## 2014-03-08 NOTE — H&P (Signed)
Attestation of Attending Supervision of Obstetric Fellow: Evaluation and management procedures were performed by the Obstetric Fellow under my supervision and collaboration.  I have reviewed the Obstetric Fellow's note and chart, and I agree with the management and plan.  Jacob Stinson, DO Attending Physician Faculty Practice, [facility]  

## 2014-03-08 NOTE — Discharge Summary (Signed)
Obstetric Discharge Summary Reason for Admission: onset of labor Prenatal Procedures: none Intrapartum Procedures: spontaneous vaginal delivery Postpartum Procedures: none Complications-Operative and Postpartum: none  Delivery Note At 2:15 AM a viable female was delivered via Vaginal, Spontaneous Delivery (Presentation: Left Occiput Anterior).  APGAR: 9, 9; weight 6 lb 8.8 oz (2970 g).   Placenta status: Intact, Spontaneous.  Cord: 3 vessels with the following complications: None.  Anesthesia: Epidural  Episiotomy: None Lacerations: None Suture Repair: n/a Est. Blood Loss (mL): 400  Mom to postpartum.  Baby to Couplet care / Skin to Skin.  Lindsay Gutierrez Lindsay Gutierrez 03/08/2014, 7:56 PM     Hospital Course:  Active Problems:   Normal delivery   Today: No acute events overnight.  Pt denies problems with ambulating, voiding or po intake.  She denies nausea or vomiting.  Pain is well controlled.  Lochia Minimal.  Plan for birth control is  still undecided, considering IUD.  Method of Feeding: breast  Lindsay Gutierrez is a 28 y.o. [id number] s/p SVD.  Patient presented to [facility] with SOL and was admitted to L&D.  She has postpartum course that was uncomplicated including no problems with ambulating, PO intake, urination, pain, or bleeding. The pt feels ready to go home and  will be discharged with outpatient follow-up.    H/H: Lab Results  Component Value Date/Time   HGB 10.6* 03/07/2014 12:31 AM   HGB 10.8 09/19/2013   HGB 12.4 05/28/2012  4:32 PM   HCT 32.6* 03/07/2014 12:31 AM   HCT 32 09/19/2013   HCT 38.8 05/28/2012  4:32 PM    Discharge Diagnoses: Term Pregnancy-delivered  Discharge Information: Date: 03/08/2014 Activity: pelvic rest Diet: routine  Medications: None Breast feeding:  Yes Condition: stable Instructions: refer to handout Discharge to: home   Discharge Instructions   Call MD for:  redness, tenderness, or signs of infection (pain, swelling, redness, odor or  green/yellow discharge around incision site)    Complete by:  As directed      Call MD for:  severe uncontrolled pain    Complete by:  As directed      Call MD for:  temperature >100.4    Complete by:  As directed      Diet - low sodium heart healthy    Complete by:  As directed             Medication List         acetaminophen 500 MG tablet  Commonly known as:  TYLENOL  Take 1 tablet (500 mg total) by mouth every 6 (six) hours as needed.     prenatal vitamin w/FE, FA 29-1 MG Chew chewable tablet  Chew 1 tablet by mouth daily at 12 noon.           Follow-up Information   Follow up with [facility] In 6 weeks. (postpartum visit)       Perry Mount ,MD OB Fellow 03/08/2014,7:56 PM

## 2014-03-08 NOTE — Progress Notes (Signed)
Post Partum Day 1 Subjective: no complaints, up ad lib, voiding and + flatus  Objective: Blood pressure 91/59, pulse 72, temperature 97.9 F (36.6 C), temperature source Oral, resp. rate 18, height 5' 1.5" (1.562 m), weight 70.308 kg (155 lb), last menstrual period 06/15/2013, SpO2 97.00%, unknown if currently breastfeeding.  Physical Exam:  General: alert, cooperative, appears stated age and no distress Lochia: appropriate Uterine Fundus: firm DVT Evaluation: No cords or calf tenderness. No significant calf/ankle edema.   Recent Labs  03/07/14 0031  HGB 10.6*  HCT 32.6*    Assessment/Plan:  D/C home tomorrow  Breast and bottle  Depo shots as choice of contraception   LOS: 2 days   Rosemary Holms 03/08/2014, 9:18 AM   OB fellow attestation I have seen patient and agree with plan, patient requested discharge.  See discharge summary for further documentation  Perry Mount, MD 7:43 PM

## 2014-03-08 NOTE — Progress Notes (Signed)
Brought in interpreter to review safe sleep and assess breastfeeding complications. Pt states that baby was just crying after latching on, and was still hungry which is why she has been giving baby formula bottles. Sheryn Bison

## 2014-03-08 NOTE — Lactation Note (Signed)
This note was copied from the chart of Lindsay Tamya Denardo. Lactation Consultation Note  Interpreter Marly present. Mother has a positional stripe on right nipple.  Both nipples are tender so she has been mostly formula feeding. Reviewed hand expression, supply and demand.  Mother has a good flow of colostrum. Reviewed football and cross cradle hold and waking techniques.  Attempted latch but baby too sleepy. Provided mother with comfort gels and a hand pump. Discussed engorgement care. Mom encouraged to feed baby 8-12 times/24 hours and with feeding cues.    Patient Name: Lindsay Gutierrez [id number] Date: 03/08/2014 Reason for consult: Follow-up assessment   Maternal Data Has patient been taught Hand Expression?: Yes  Feeding    LATCH Score/Interventions                      Lactation Tools Discussed/Used     Consult Status Consult Status: Follow-up Date: 03/09/14 Follow-up type: In-patient    Dahlia Byes [facility] 03/08/2014, 4:01 PM

## 2014-03-10 ENCOUNTER — Ambulatory Visit (HOSPITAL_COMMUNITY): Payer: Self-pay

## 2014-03-12 NOTE — Discharge Summary (Signed)
Attestation of Attending Supervision of Obstetric Fellow: Evaluation and management procedures were performed by the Obstetric Fellow under my supervision and collaboration.  I have reviewed the Obstetric Fellow's note and chart, and I agree with the management and plan.  Maxwell Lemen, DO Attending Physician Faculty Practice, [facility]  

## 2014-03-17 ENCOUNTER — Ambulatory Visit (HOSPITAL_COMMUNITY): Payer: Self-pay

## 2014-04-17 ENCOUNTER — Encounter (HOSPITAL_COMMUNITY): Payer: Self-pay | Admitting: *Deleted

## 2014-04-19 ENCOUNTER — Ambulatory Visit: Payer: Self-pay | Admitting: Obstetrics & Gynecology

## 2014-04-19 ENCOUNTER — Ambulatory Visit (INDEPENDENT_AMBULATORY_CARE_PROVIDER_SITE_OTHER): Payer: Self-pay | Admitting: Family Medicine

## 2014-04-19 ENCOUNTER — Encounter: Payer: Self-pay | Admitting: Family Medicine

## 2014-04-19 MED ORDER — NORGESTIMATE-ETH ESTRADIOL 0.25-35 MG-MCG PO TABS: 1.0000 | ORAL_TABLET | Freq: Every day | ORAL | Status: DC

## 2014-04-19 NOTE — Progress Notes (Signed)
  Subjective:     Lindsay Gutierrez is a [age] y.o. female who presents for a postpartum visit. She is 6 weeks postpartum following a spontaneous vaginal delivery. I have fully reviewed the prenatal and intrapartum course. The delivery was at 39 gestational weeks. Outcome: spontaneous vaginal delivery. Anesthesia: none. Postpartum course has been normal. Baby's course has been normal. Baby is feeding by breast with formula supplementation. Bleeding no bleeding. Bowel function is normal. Bladder function is normal. Patient is not sexually active. Contraception method is tubal ligation. Postpartum depression screening: negative.  The following portions of the patient's history were reviewed and updated as appropriate: allergies, current medications, past family history, past medical history, past social history, past surgical history and problem list.  Review of Systems Pertinent items are noted in HPI.   Objective:    There were no vitals taken for this visit.  General:  alert, cooperative and no distress  Lungs: clear to auscultation bilaterally  Heart:  regular rate and rhythm, S1, S2 normal, no murmur, click, rub or gallop  Abdomen: soft, non-tender; bowel sounds normal; no masses,  no organomegaly        Assessment:     Normal postpartum exam. Pap smear not done at today's visit.   Plan:    1. Contraception: tubal ligation 2. Follow up in: 1 year or as needed.  Subjective:     Lindsay Gutierrez is a 28 y.o. female who presents for a postpartum visit. She is 6 weeks postpartum following a spontaneous vaginal delivery. I have fully reviewed the prenatal and intrapartum course. The delivery was at 38 gestational weeks. Outcome: spontaneous vaginal delivery. Anesthesia: none. Postpartum course has been normal. Baby's course has been normal. Baby is feeding by formula. Bleeding no bleeding. Bowel function is normal. Bladder function is normal. Patient is not sexually active. Contraception method is  Depo-Provera injections. Postpartum depression screening: negative.  The following portions of the patient's history were reviewed and updated as appropriate: allergies, current medications, past family history, past medical history, past social history, past surgical history and problem list.  Review of Systems Pertinent items are noted in HPI.   Objective:    BP 110/73 mmHg  Pulse 72  Temp(Src) 98.2 F (36.8 C) (Oral)  Ht 5\' 1"  (1.549 m)  Wt 133 lb 14.4 oz (60.737 kg)  BMI 25.31 kg/m2  LMP 04/16/2014 (Exact Date)  Breastfeeding? No  General:  alert, cooperative and no distress  Lungs: clear to auscultation bilaterally  Heart:  regular rate and rhythm, S1, S2 normal, no murmur, click, rub or gallop  Abdomen: soft, non-tender; bowel sounds normal; no masses,  no organomegaly        Assessment:     Normal postpartum exam. Pap smear not done at today's visit.   Plan:    1. Contraception: OCP until can go to HD for Depo-Provera injections 2.  Follow up in: 1 year or as needed.

## 2014-05-30 ENCOUNTER — Encounter: Payer: Self-pay | Admitting: *Deleted

## 2014-07-14 IMAGING — US US OB LIMITED
1 series · 13 of 28 positions shown · non-contrast
Comparison: none

[Series 1: us ob limited · 0.18mm/px · 13 of 32 slices shown]
[im 2/32]
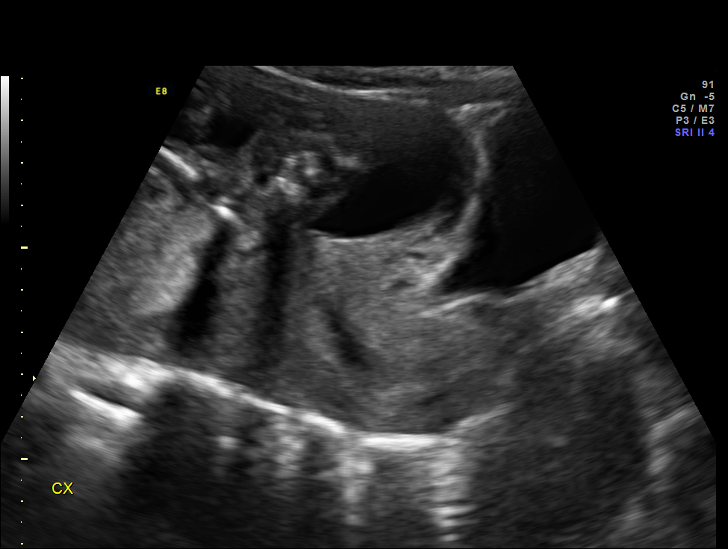
[im 4/32]
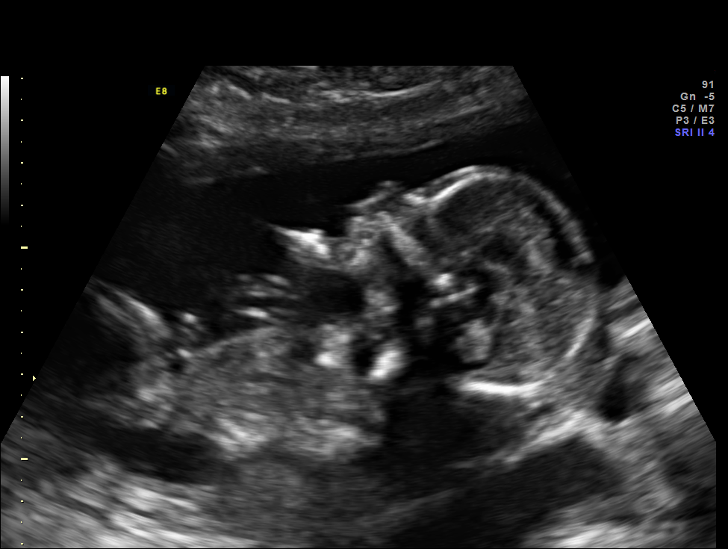
[im 6/32]
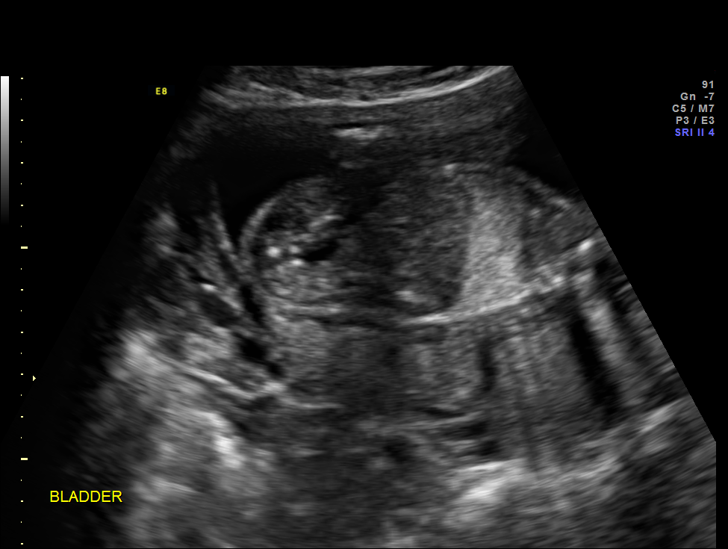
[im 9/32]
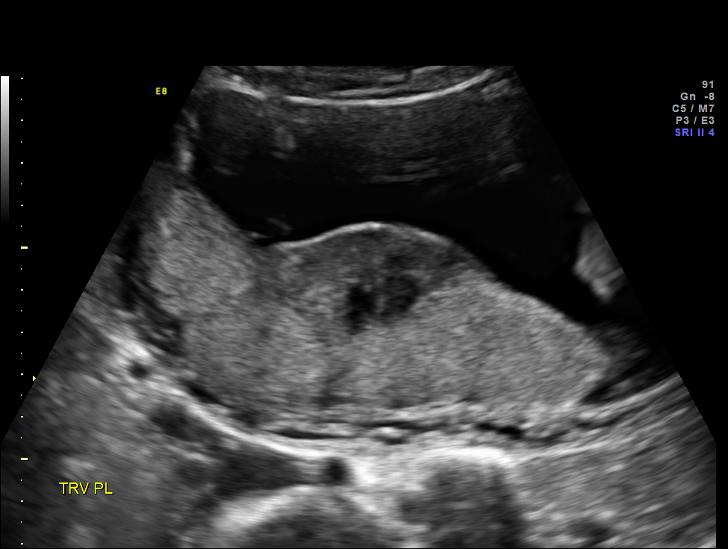
[im 11/32]
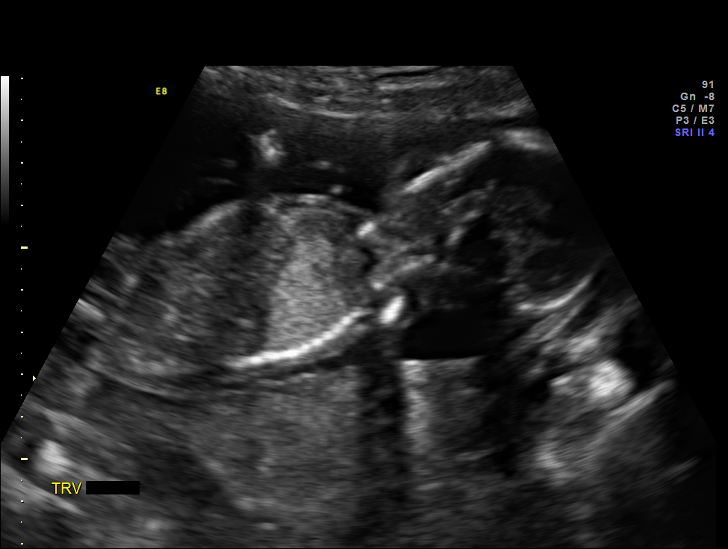
[im 13/32]
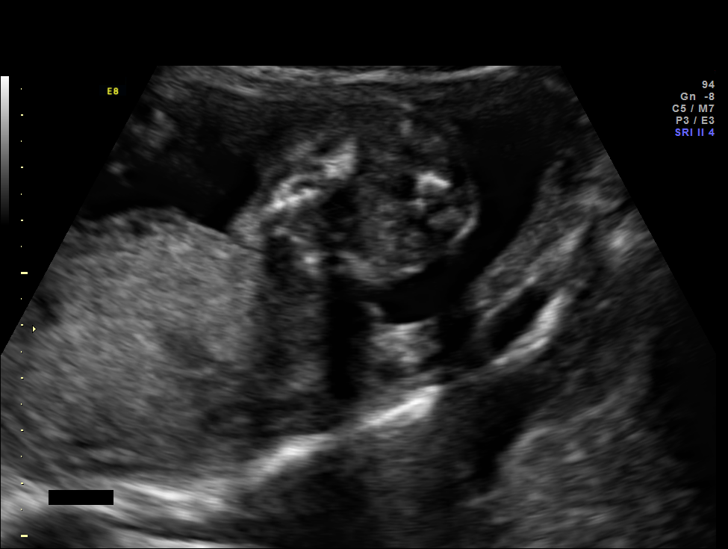
[im 17/32]
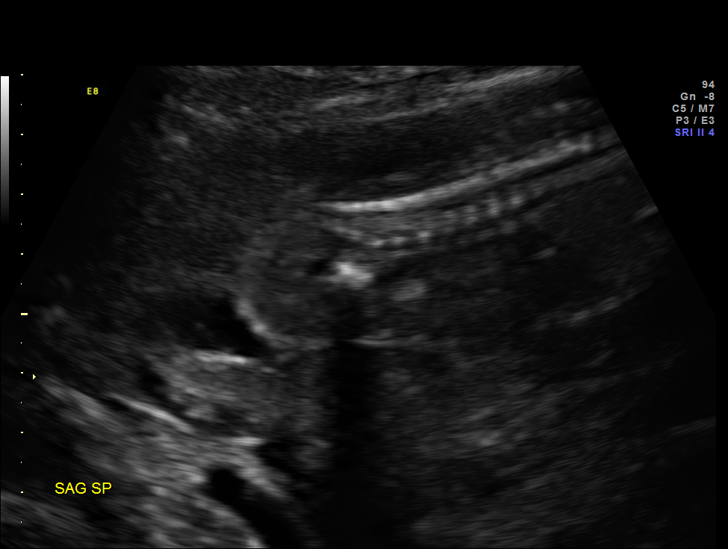
[im 19/32]
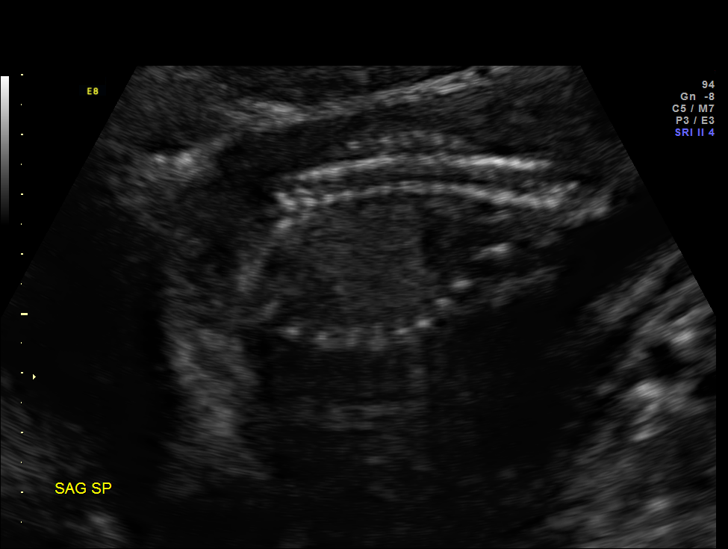
[im 21/32]
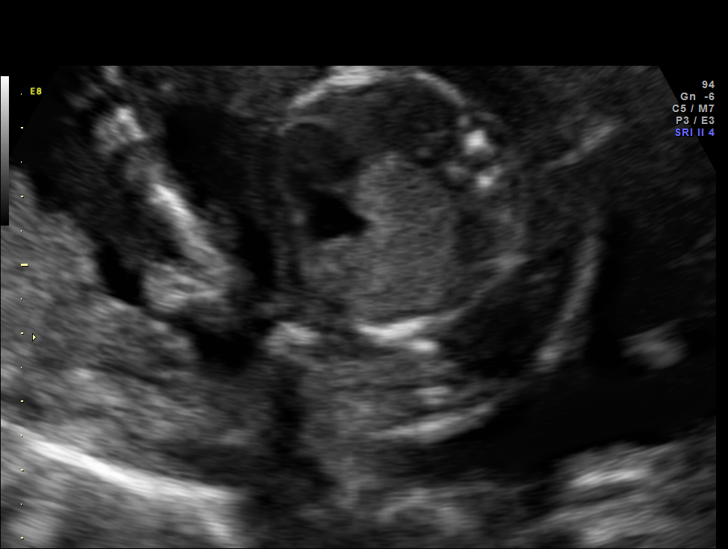
[im 23/32]
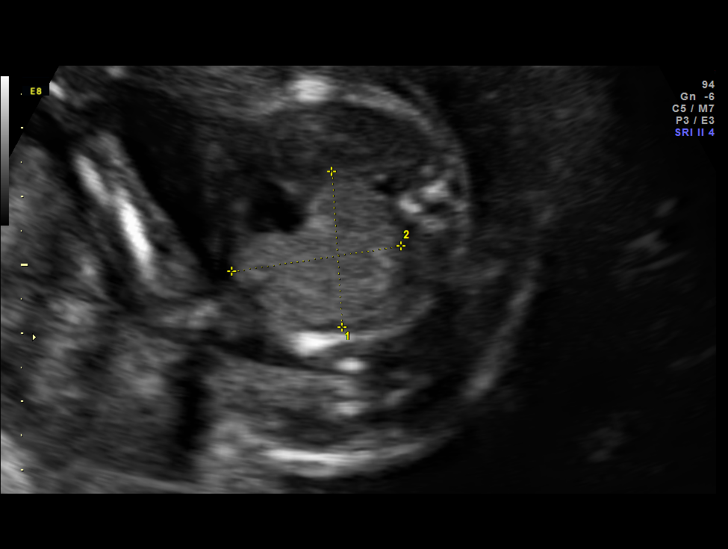
[im 26/32]
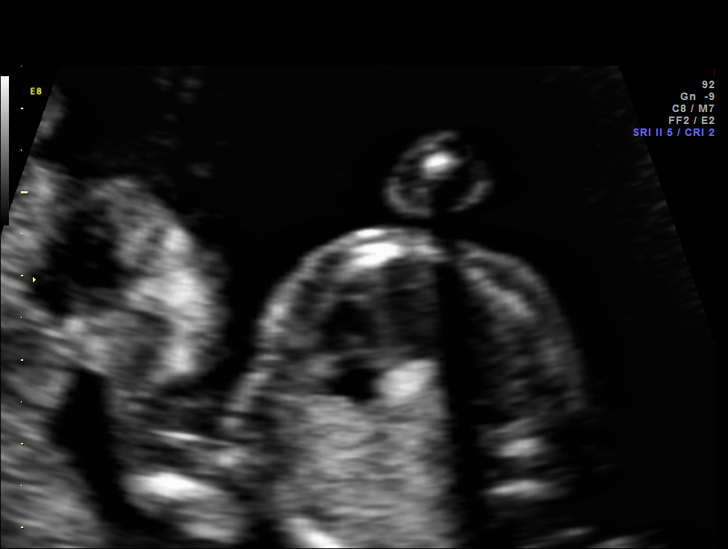
[im 28/32]
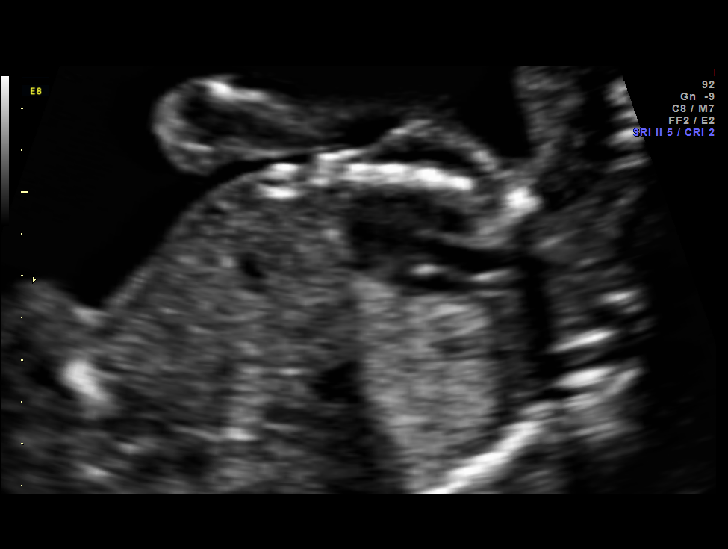
[im 30/32]
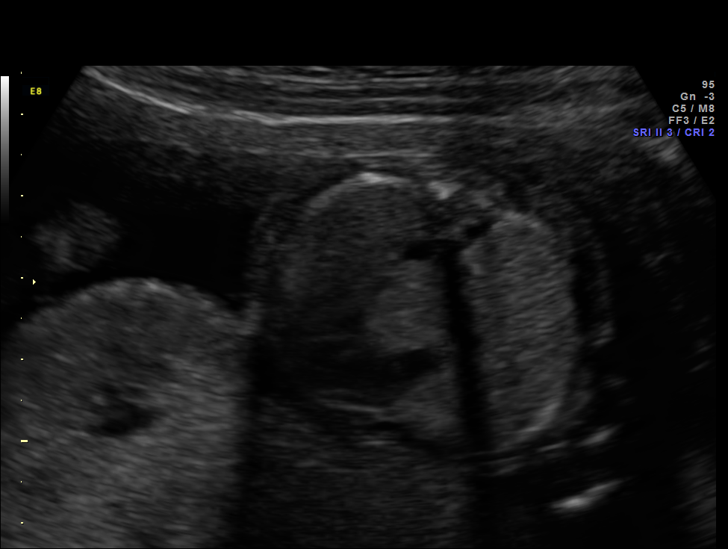

[13 of 28 positions shown; findings below may reference images not displayed]

OBSTETRICS REPORT

                                                         Faculty Physician
Service(s) Provided

 [HOSPITAL]                                         [address].0
Indications

 Fetal abnormality - other known or suspected (right
 chest mass)
Fetal Evaluation

 Num Of Fetuses:    1
 Fetal Heart Rate:  152                          bpm
 Cardiac Activity:  Observed
 Presentation:      Transverse, head to
                    maternal left
 Placenta:          Posterior, low-lying,
                    cm from int os
 P. Cord            Previously Visualized
 Insertion:

 Amniotic Fluid
 AFI FV:      Subjectively within normal limits
                                             Larg Pckt:     4.3  cm
Gestational Age

 LMP:           19w 2d        Date:  06/15/13                 EDD:   03/22/14
 Best:          19w 2d     Det. By:  LMP  (06/15/13)          EDD:   03/22/14
Anatomy

 Cranium:          Previously seen        Aortic Arch:      Not well visualized
 Fetal Cavum:      Previously seen        Ductal Arch:      Not well visualized
 Ventricles:       Previously seen        Diaphragm:        Appears normal
 Choroid Plexus:   Previously seen        Stomach:          Appears normal, left
                                                            sided
 Cerebellum:       Previously seen        Abdomen:          Previously seen
 Posterior Fossa:  Previously seen        Abdominal Wall:   Previously seen
 Nuchal Fold:      Previously seen        Cord Vessels:     Previously seen
 Face:             Orbits and profile     Kidneys:          Previously seen
                   previously seen
 Lips:             Previously seen        Bladder:          Appears normal
 Heart:            Previously seen        Spine:            Appears normal
 RVOT:             Not well visualized    Lower             Previously seen
                                          Extremities:
 LVOT:             Previously seen        Upper             Previously seen
                                          Extremities:

 Other:  Female gender. Heels and 5th digit previously seen.
Cervix Uterus Adnexa

 Cervix:       Normal appearance by transabdominal scan. Appears
               closed, without funnelling.
Impression

 SIUP at 19+2 weeks
 Right-sided CPAM
 Limited exam for hydrops; none was seen
Recommendations

 Follow-up ultrasound weekly to reassess for hydrops
 Growth US in 
 3 weeks
 Fetal ECHO scheduled

 questions or concerns.

## 2014-08-25 IMAGING — US US OB LIMITED
1 series · 13 of 23 positions shown · non-contrast
Comparison: none

[Series 1: us ob limited · 0.23mm/px · 13 of 23 slices shown]
[im 1/23]
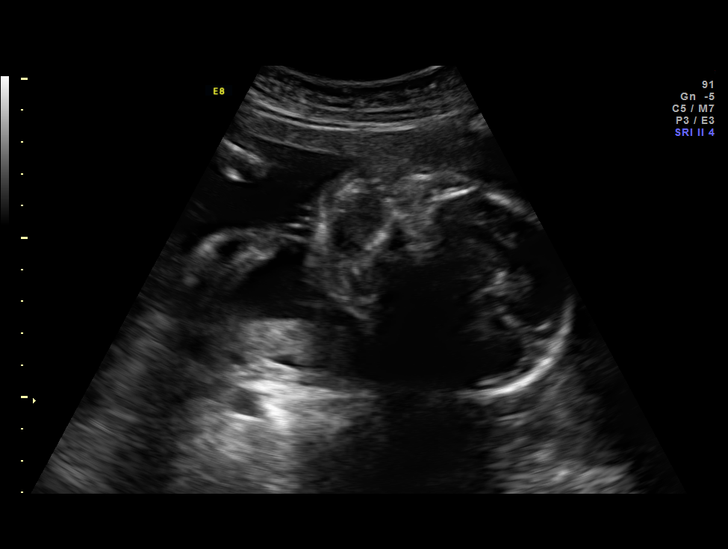
[im 3/23]
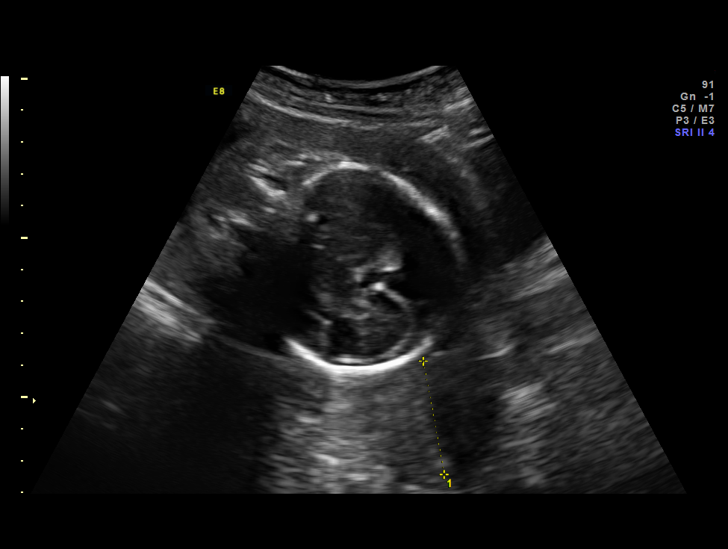
[im 5/23]
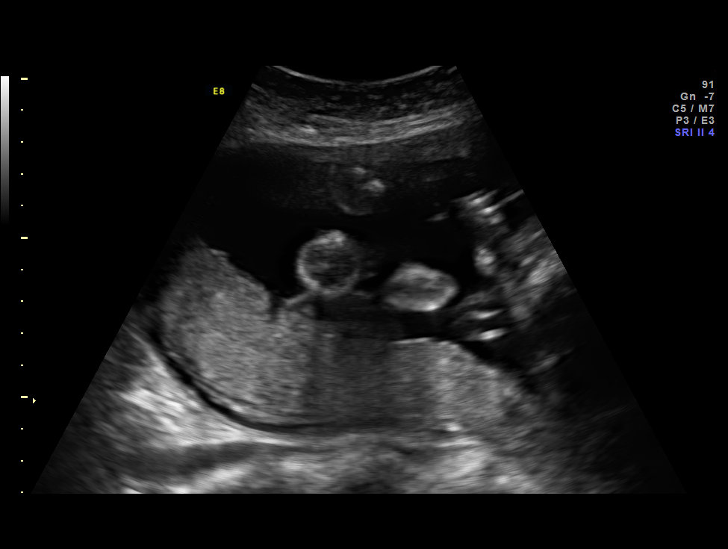
[im 7/23]
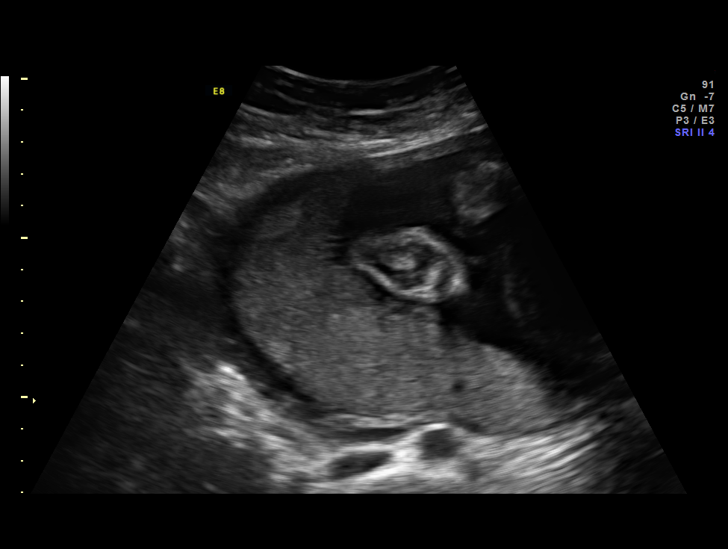
[im 8/23]
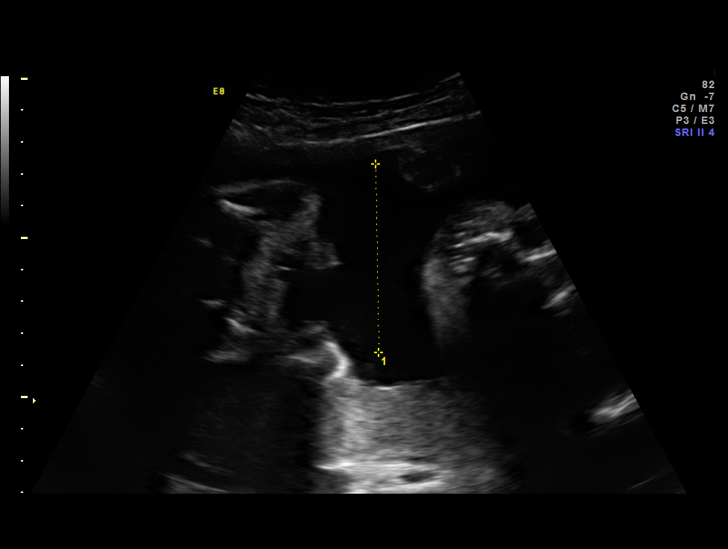
[im 10/23]
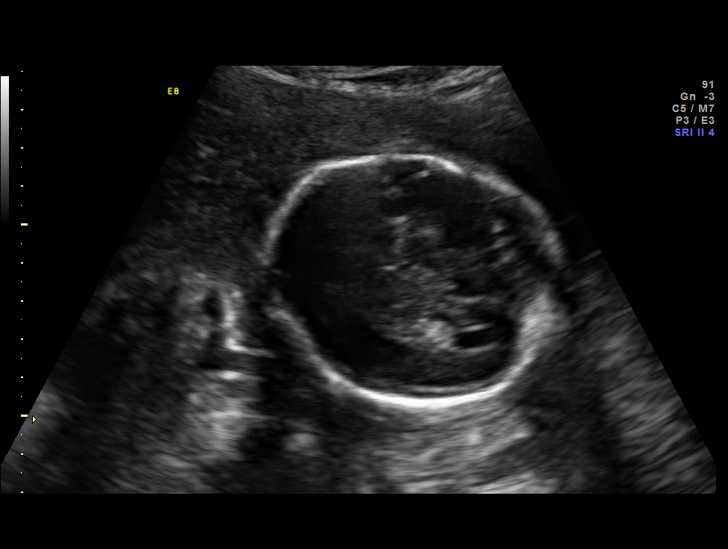
[im 12/23]
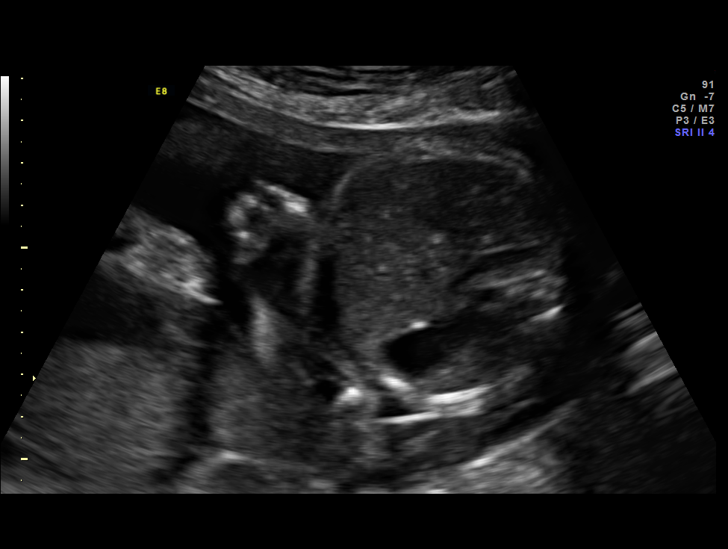
[im 14/23]
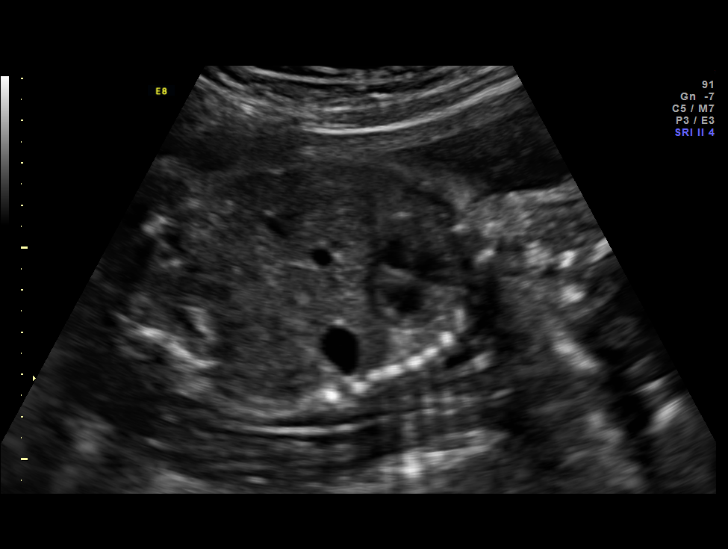
[im 16/23]
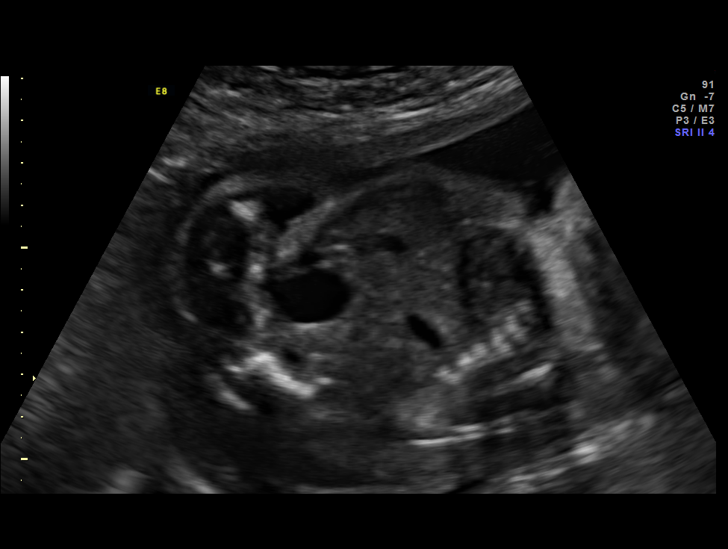
[im 17/23]
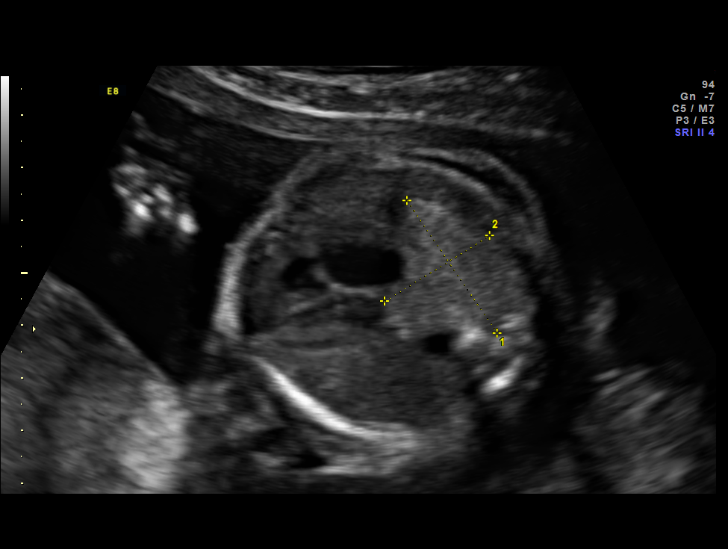
[im 19/23]
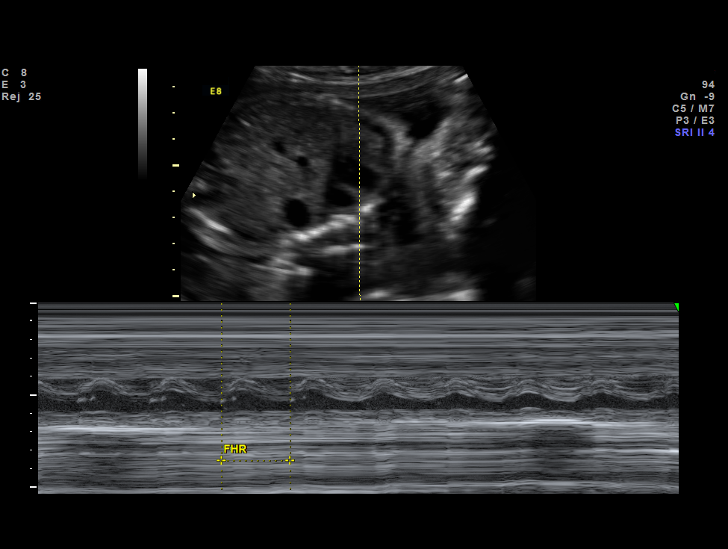
[im 21/23]
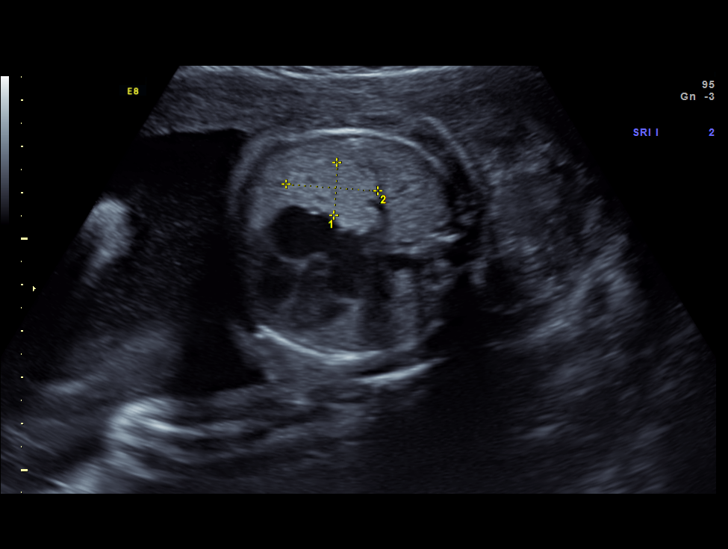
[im 23/23]
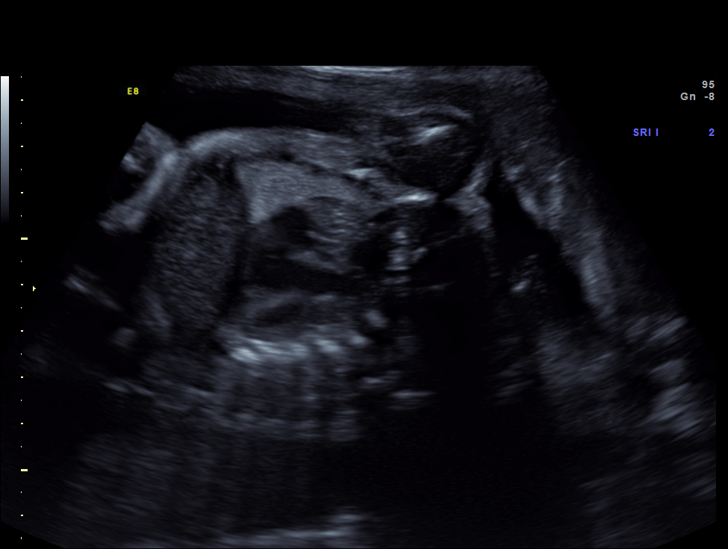

[13 of 23 positions shown; findings below may reference images not displayed]

OBSTETRICS REPORT
                      (Signed Final 12/09/2013 [DATE])

Service(s) Provided

 [HOSPITAL]                                         [address].0
Indications

 Fetal abnormality - right chest mass; most c/w
 CPAM type 3; S/P BMZ
 Normal Fetal ECHO
Fetal Evaluation

 Num Of Fetuses:    1
 Cardiac Activity:  Observed
 Presentation:      Breech
 Placenta:          Posterior, above cervical
                    os
 P. Cord            Previously Visualized
 Insertion:

 Amniotic Fluid
 AFI FV:      Subjectively within normal limits
                                             Larg Pckt:     5.9  cm
Gestational Age

 LMP:           25w 2d        Date:  06/15/13                 EDD:   03/22/14
 Best:          25w 2d     Det. By:  LMP  (06/15/13)          EDD:   03/22/14
Cervix Uterus Adnexa

 Cervical Length:    3.6      cm

 Cervix:       Normal appearance by transabdominal scan. Appears
               closed, without funnelling.
Comments

 CPAM 3.05 x 2.36 x 1.62 cms
 HC 23.41 cm
 CVR
Impression

 SIUP at 25+2 weeks
 Right CPAM type 3; no mediastinal shift
 Normal amniotic fluid volume
 No findings of hydrops and CVR indicates very little risk of
 hydrops
Recommendations

 Follow-up ultrasound for growth and to reassess CPAM in 2
 weeks

 questions or concerns.

## 2016-07-21 ENCOUNTER — Inpatient Hospital Stay (HOSPITAL_COMMUNITY): Payer: Self-pay

## 2016-07-21 ENCOUNTER — Encounter (HOSPITAL_COMMUNITY): Payer: Self-pay | Admitting: *Deleted

## 2016-07-21 ENCOUNTER — Inpatient Hospital Stay (HOSPITAL_COMMUNITY)
Admission: AD | Admit: 2016-07-21 | Discharge: 2016-07-21 | Disposition: A | Discharge status: Home / Self Care | Payer: Self-pay | Source: Ambulatory Visit | Attending: Obstetrics & Gynecology | Admitting: Obstetrics & Gynecology

## 2016-07-21 DIAGNOSIS — R102 Pelvic and perineal pain: Secondary | ICD-10-CM

## 2016-07-21 DIAGNOSIS — N939 Abnormal uterine and vaginal bleeding, unspecified: Secondary | ICD-10-CM

## 2016-07-21 DIAGNOSIS — Z3A16 16 weeks gestation of pregnancy: Secondary | ICD-10-CM | POA: Insufficient documentation

## 2016-07-21 DIAGNOSIS — O021 Missed abortion: Secondary | ICD-10-CM

## 2016-07-21 LAB — CBC
HCT: 36.1 % (ref 36.0–46.0)
HCT: 33.7 % — ABNORMAL LOW (ref 36.0–46.0)
Hemoglobin: 12.2 g/dL (ref 12.0–15.0)
Hemoglobin: 11.5 g/dL — ABNORMAL LOW (ref 12.0–15.0)
MCH: 29.4 pg (ref 26.0–34.0)
MCH: 29.5 pg (ref 26.0–34.0)
MCHC: 33.8 g/dL (ref 30.0–36.0)
MCHC: 34.1 g/dL (ref 30.0–36.0)
MCV: 87 fL (ref 78.0–100.0)
MCV: 86.4 fL (ref 78.0–100.0)
PLATELETS: 268 10*3/uL (ref 150–400)
Platelets: 286 10*3/uL (ref 150–400)
RBC: 4.15 MIL/uL (ref 3.87–5.11)
RBC: 3.9 MIL/uL (ref 3.87–5.11)
RDW: 13.2 % (ref 11.5–15.5)
RDW: 13.2 % (ref 11.5–15.5)
WBC: 9.9 10*3/uL (ref 4.0–10.5)
WBC: 10.5 10*3/uL (ref 4.0–10.5)

## 2016-07-21 LAB — HCG, QUANTITATIVE, PREGNANCY: HCG, BETA CHAIN, QUANT, S: 4553 m[IU]/mL — AB (ref <5)

## 2016-07-21 LAB — POCT PREGNANCY, URINE: Preg Test, Ur: POSITIVE — AB

## 2016-07-21 MED ORDER — MISOPROSTOL 200 MCG PO TABS
800.0000 ug | ORAL_TABLET | Freq: Once | ORAL | Status: AC
  Administered 2016-07-21: 800 ug via ORAL
  Filled 2016-07-21: qty 4

## 2016-07-21 MED ORDER — KETOROLAC TROMETHAMINE 60 MG/2ML IM SOLN
60.0000 mg | Freq: Once | INTRAMUSCULAR | Status: AC
  Administered 2016-07-21: 60 mg via INTRAMUSCULAR
  Filled 2016-07-21: qty 2

## 2016-07-21 MED ORDER — OXYCODONE-ACETAMINOPHEN 5-325 MG PO TABS
1.0000 | ORAL_TABLET | Freq: Once | ORAL | Status: AC
  Administered 2016-07-21: 1 via ORAL
  Filled 2016-07-21: qty 1

## 2016-07-21 MED ORDER — SODIUM CHLORIDE 0.9 % IV SOLN
Freq: Once | INTRAVENOUS | Status: AC
  Administered 2016-07-21: 11:00:00 via INTRAVENOUS

## 2016-07-21 MED ORDER — OXYCODONE-ACETAMINOPHEN 5-325 MG PO TABS: 1.0000 | ORAL_TABLET | ORAL | 0 refills | Status: DC | PRN

## 2016-07-21 MED ORDER — IBUPROFEN 600 MG PO TABS: 600.0000 mg | ORAL_TABLET | Freq: Four times a day (QID) | ORAL | 0 refills | Status: DC | PRN

## 2016-07-21 NOTE — MAU Provider Note (Signed)
Lindsay Gutierrez is a [id number] @ 423w1d gestation by LMP who presents to the MAU with heavy bleeding and abdominal pain.   BP 102/64 (BP Location: Right Arm)   Pulse 104   Temp 98.7 F (37.1 C) (Oral)   Resp 16   Ht 5\' 2"  (1.575 m)   Wt 138 lb (62.6 kg)   LMP 03/30/2016 (Approximate) Comment: sometime in Oct  BMI 25.24 kg/m    This is a shared visit with Victorino DikeJennifer Rash, NP, I am continuing care while Victorino DikeJennifer has to leave the unit for a short time.   @ 11:00 am patient continues to have bleeding. Orthostatic VS ordered and patient's heart rate goes up 40 BPM from lying to standing.  Normal saline bolus of 1,000 ccs ordered and repeat CBC. Will continue to observe.   12:10 pm  Results for orders placed or performed during the hospital encounter of 07/21/16 (from the past 24 hour(s))  Pregnancy, urine POC     Status: Abnormal   Collection Time: 07/21/16  7:25 AM  Result Value Ref Range   Preg Test, Ur POSITIVE (A) NEGATIVE  CBC     Status: None   Collection Time: 07/21/16  7:42 AM  Result Value Ref Range   WBC 9.9 4.0 - 10.5 K/uL   RBC 4.15 3.87 - 5.11 MIL/uL   Hemoglobin 12.2 12.0 - 15.0 g/dL   HCT 02.736.1 25.336.0 - 66.446.0 %   MCV 87.0 78.0 - 100.0 fL   MCH 29.4 26.0 - 34.0 pg   MCHC 33.8 30.0 - 36.0 g/dL   RDW 40.313.2 47.411.5 - 25.915.5 %   Platelets 286 150 - 400 K/uL  hCG, quantitative, pregnancy     Status: Abnormal   Collection Time: 07/21/16  7:42 AM  Result Value Ref Range   hCG, Beta Chain, Quant, S 4,553 (H) <5 mIU/mL  CBC     Status: Abnormal   Collection Time: 07/21/16 11:26 AM  Result Value Ref Range   WBC 10.5 4.0 - 10.5 K/uL   RBC 3.90 3.87 - 5.11 MIL/uL   Hemoglobin 11.5 (L) 12.0 - 15.0 g/dL   HCT 56.333.7 (L) 87.536.0 - 64.346.0 %   MCV 86.4 78.0 - 100.0 fL   MCH 29.5 26.0 - 34.0 pg   MCHC 34.1 30.0 - 36.0 g/dL   RDW 32.913.2 51.811.5 - 84.115.5 %   Platelets 268 150 - 400 K/uL   @ 12:10 pm care returned to [facility] NP. See J. Rash complete note after she returned to complete the care of  the patient.

## 2016-07-21 NOTE — MAU Provider Note (Signed)
History     CSN: [id number]  Arrival date and time: 07/21/16 0650   First Provider Initiated Contact with Patient 07/21/16 [phone]      Chief Complaint  Patient presents with  . Abdominal Cramping  . Vaginal Bleeding   HPI   Lindsay Gutierrez, spanish interpretor at bedside.  Lindsay Gutierrez is a 31 y.o. female [phone] @ [redacted]w[redacted]d here in MAU with abdominal pain and vaginal bleeding. The bleeding started 1 week ago, however has increased in the last 24 hours. The bleeding is heavy like a menstrual cycle. The pain is cramp like pain, and come and goes. She has not had any prenatal care.   She has had no other episodes of bleeding with this pregnancy. She is unsure of her LMP; she thinks it was the beginning of Oct or the End of Oct. She is certain she did not have bleeding in Nov, Dec or January.   OB History    Gravida Para Term Preterm AB Living   6 2 2   3 2    SAB TAB Ectopic Multiple Live Births   2   1   2       Obstetric Comments   D&E with first SAB, MTX x2      Past Medical History:  Diagnosis Date  . Medical history non-contributory   . Other ectopic pregnancy without intrauterine pregnancy 10/27/2012    Past Surgical History:  Procedure Laterality Date  . DILATION AND CURETTAGE OF UTERUS      Family History  Problem Relation Age of Onset  . Cancer Paternal Grandfather   . Asthma Neg Hx   . Diabetes Neg Hx   . Heart disease Neg Hx   . Hypertension Neg Hx   . Stroke Neg Hx     Social History  Substance Use Topics  . Smoking status: Never Smoker  . Smokeless tobacco: Never Used  . Alcohol use No    Allergies: No Known Allergies  Prescriptions Prior to Admission  Medication Sig Dispense Refill Last Dose  . Prenatal Vit-Fe Fumarate-FA (PRENATAL MULTIVITAMIN) TABS tablet Take 1 tablet by mouth daily at 12 noon.   07/20/2016 at Unknown time  . acetaminophen (TYLENOL) 500 MG tablet Take 1 tablet (500 mg total) by mouth every 6 (six) hours as needed. (Patient not taking:  Reported on 07/21/2016) 30 tablet 0 Not Taking at Unknown time  . norgestimate-ethinyl estradiol (ORTHO-CYCLEN,SPRINTEC,PREVIFEM) 0.25-35 MG-MCG tablet Take 1 tablet by mouth daily. (Patient not taking: Reported on 07/21/2016) 1 Package 3 Not Taking at Unknown time  . prenatal vitamin w/FE, FA (NATACHEW) 29-1 MG CHEW chewable tablet Chew 1 tablet by mouth daily at 12 noon. (Patient not taking: Reported on 07/21/2016) 30 tablet 6 Not Taking at Unknown time   Results for orders placed or performed during the hospital encounter of 07/21/16 (from the past 48 hour(s))  Pregnancy, urine POC     Status: Abnormal   Collection Time: 07/21/16  7:25 AM  Result Value Ref Range   Preg Test, Ur POSITIVE (A) NEGATIVE    Comment:        THE SENSITIVITY OF THIS METHODOLOGY IS >24 mIU/mL   CBC     Status: None   Collection Time: 07/21/16  7:42 AM  Result Value Ref Range   WBC 9.9 4.0 - 10.5 K/uL   RBC 4.15 3.87 - 5.11 MIL/uL   Hemoglobin 12.2 12.0 - 15.0 g/dL   HCT 65.7 84.6 - 96.2 %   MCV 87.0 78.0 -  100.0 fL   MCH 29.4 26.0 - 34.0 pg   MCHC 33.8 30.0 - 36.0 g/dL   RDW 11.9 14.7 - 82.9 %   Platelets 286 150 - 400 K/uL  hCG, quantitative, pregnancy     Status: Abnormal   Collection Time: 07/21/16  7:42 AM  Result Value Ref Range   hCG, Beta Chain, Quant, S 4,553 (H) <5 mIU/mL    Comment:          GEST. AGE      CONC.  (mIU/mL)   <=1 WEEK        5 - 50     2 WEEKS       50 - 500     3 WEEKS       100 - 10,000     4 WEEKS     1,000 - 30,000     5 WEEKS     3,500 - 115,000   6-8 WEEKS     12,000 - 270,000    12 WEEKS     15,000 - 220,000        FEMALE AND NON-PREGNANT FEMALE:     LESS THAN 5 mIU/mL    US Ob Comp Less 14 Wks  Result Date: 07/21/2016 CLINICAL DATA:  Bleeding for 1 week heavier today, cramping for 1 week, pregnant, no quantitative beta HCG available for correlation at time of interpretation EXAM: OBSTETRIC <14 WK [place] AND TRANSVAGINAL OB US TECHNIQUE: Both transabdominal and transvaginal  ultrasound examinations were performed for complete evaluation of the gestation as well as the maternal uterus, adnexal regions, and pelvic cul-de-sac. Transvaginal technique was performed to assess early pregnancy. COMPARISON:  None for this gestation FINDINGS: Intrauterine gestational sac: Present but elongated and abnormal in position, located at the lower uterine segment Yolk sac:  Questionably present Embryo:  Not definitely seen Cardiac Activity: N/A Heart Rate: N/A  bpm MSD: 16.2  mm   6 w   3  d Subchorionic hemorrhage:  None visualized. Maternal uterus/adnexae: LEFT ovary measures 4.0 x 1.5 x 1.8 cm and contains a small hemorrhagic corpus luteal cyst. RIGHT ovary normal size and morphology 1.1 x 1.0 x 2.3 cm. No free pelvic fluid or adnexal masses IMPRESSION: Intrauterine gestational sac visualized but abnormal in position at lower uterine segment and elongated, questionably containing a yolk sac. Patient was actively bleeding during the ultrasound exam. Findings are suspicious for a spontaneous abortion in progress. No sonographic evidence of ectopic pregnancy. Electronically Signed   By: Ulyses Southward M.D.   On: 07/21/2016 08:59   US Ob Transvaginal  Result Date: 07/21/2016 CLINICAL DATA:  Bleeding for 1 week heavier today, cramping for 1 week, pregnant, no quantitative beta HCG available for correlation at time of interpretation EXAM: OBSTETRIC <14 WK [place] AND TRANSVAGINAL OB US TECHNIQUE: Both transabdominal and transvaginal ultrasound examinations were performed for complete evaluation of the gestation as well as the maternal uterus, adnexal regions, and pelvic cul-de-sac. Transvaginal technique was performed to assess early pregnancy. COMPARISON:  None for this gestation FINDINGS: Intrauterine gestational sac: Present but elongated and abnormal in position, located at the lower uterine segment Yolk sac:  Questionably present Embryo:  Not definitely seen Cardiac Activity: N/A Heart Rate: N/A  bpm MSD:  16.2  mm   6 w   3  d Subchorionic hemorrhage:  None visualized. Maternal uterus/adnexae: LEFT ovary measures 4.0 x 1.5 x 1.8 cm and contains a small hemorrhagic corpus luteal cyst. RIGHT ovary normal size and  morphology 1.1 x 1.0 x 2.3 cm. No free pelvic fluid or adnexal masses IMPRESSION: Intrauterine gestational sac visualized but abnormal in position at lower uterine segment and elongated, questionably containing a yolk sac. Patient was actively bleeding during the ultrasound exam. Findings are suspicious for a spontaneous abortion in progress. No sonographic evidence of ectopic pregnancy. Electronically Signed   By: Ulyses SouthwardMark  Boles M.D.   On: 07/21/2016 08:59   Review of Systems  Constitutional: Negative for fever.  Gastrointestinal: Positive for abdominal pain.  Genitourinary: Positive for vaginal bleeding.  Neurological: Negative for dizziness.   Physical Exam   Blood pressure 102/64, pulse 104, temperature 98.7 F (37.1 C), temperature source Oral, resp. rate 16, height 5\' 2"  (1.575 m), weight 138 lb (62.6 kg), last menstrual period 03/30/2016.  Physical Exam  Constitutional: She is oriented to person, place, and time. She appears well-developed and well-nourished. No distress.  HENT:  Head: Normocephalic.  Genitourinary:  Genitourinary Comments: Vagina - large amount of bright red blood with several medium sized clots.  Cervix - + active bleeding  Chaperone present for exam.   Musculoskeletal: Normal range of motion.  Neurological: She is alert and oriented to person, place, and time.  Skin: Skin is warm. She is not diaphoretic.  Psychiatric: Her behavior is normal.    MAU Course  Procedures  None  MDM  CBC & Beta hcg level O positive blood type Toradol 60 mg IM Percocet 1 tab po  US Bimanual exam: cervix open, tissue felt at os. 2 large clots extracted. Cytotec 800 mcg given Buccal  Pain 0/10; Moderate bleeding continues with clots. Discussed with Dr. Erin FullingHarraway-Smith. Will  give an additional dose of Cytotec 800 mcg buccal. Patient denies dizziness or pain at this time. Bleeding small amount at the time of discharge. Patient understandably sad at this time, however feels ok to go home.   Assessment and Plan   A:  1. Missed abortion   2. Vagina bleeding   3. Pelvic cramping     P:  Discharge home in stable condition  Strict return precautions Bleeding precautions [facility]. Will need beta hcg level.  Pelvic rest Support given  Duane LopeJennifer I Jadwiga Faidley, NP 07/21/2016 7:34 PM

## 2016-07-21 NOTE — Discharge Instructions (Signed)

## 2016-07-21 NOTE — MAU Note (Signed)
Cramping and spotting started a week ago.  Became heavier this morning.

## 2016-07-29 ENCOUNTER — Encounter (HOSPITAL_COMMUNITY): Payer: Self-pay | Admitting: *Deleted

## 2016-07-29 ENCOUNTER — Inpatient Hospital Stay (HOSPITAL_COMMUNITY): Payer: Self-pay

## 2016-07-29 ENCOUNTER — Other Ambulatory Visit: Payer: Self-pay

## 2016-07-29 ENCOUNTER — Inpatient Hospital Stay (HOSPITAL_COMMUNITY)
Admission: AD | Admit: 2016-07-29 | Discharge: 2016-07-29 | Disposition: A | Discharge status: Home / Self Care | Payer: Self-pay | Source: Ambulatory Visit | Attending: Obstetrics and Gynecology | Admitting: Obstetrics and Gynecology

## 2016-07-29 DIAGNOSIS — O3680X0 Pregnancy with inconclusive fetal viability, not applicable or unspecified: Secondary | ICD-10-CM

## 2016-07-29 DIAGNOSIS — N939 Abnormal uterine and vaginal bleeding, unspecified: Secondary | ICD-10-CM

## 2016-07-29 DIAGNOSIS — O039 Complete or unspecified spontaneous abortion without complication: Secondary | ICD-10-CM

## 2016-07-29 DIAGNOSIS — N730 Acute parametritis and pelvic cellulitis: Secondary | ICD-10-CM

## 2016-07-29 LAB — CBC
HEMATOCRIT: 22.8 % — AB (ref 36.0–46.0)
HEMOGLOBIN: 7.5 g/dL — AB (ref 12.0–15.0)
MCH: 29.3 pg (ref 26.0–34.0)
MCHC: 32.9 g/dL (ref 30.0–36.0)
MCV: 89.1 fL (ref 78.0–100.0)
Platelets: 322 10*3/uL (ref 150–400)
RBC: 2.56 MIL/uL — AB (ref 3.87–5.11)
RDW: 14.1 % (ref 11.5–15.5)
WBC: 7.5 10*3/uL (ref 4.0–10.5)

## 2016-07-29 LAB — URINALYSIS, ROUTINE W REFLEX MICROSCOPIC
Bilirubin Urine: NEGATIVE
GLUCOSE, UA: NEGATIVE mg/dL
Ketones, ur: NEGATIVE mg/dL
NITRITE: NEGATIVE
PROTEIN: NEGATIVE mg/dL
Specific Gravity, Urine: 1.012 (ref 1.005–1.030)
pH: 5 (ref 5.0–8.0)

## 2016-07-29 MED ORDER — FERROUS SULFATE 325 (65 FE) MG PO TBEC: 325.0000 mg | DELAYED_RELEASE_TABLET | Freq: Two times a day (BID) | ORAL | 1 refills | Status: DC

## 2016-07-29 MED ORDER — DOXYCYCLINE HYCLATE 100 MG PO CAPS: 100.0000 mg | ORAL_CAPSULE | Freq: Two times a day (BID) | ORAL | 0 refills | Status: AC

## 2016-07-29 MED ORDER — CEFTRIAXONE SODIUM 250 MG IJ SOLR
250.0000 mg | INTRAMUSCULAR | Status: DC
  Administered 2016-07-29: 250 mg via INTRAMUSCULAR
  Filled 2016-07-29: qty 250

## 2016-07-29 NOTE — MAU Note (Signed)
Patient c/o coffee color discharge with foul odor and abdominal pain x3 day. Has tried using the pain medication prescribed to her. Stabbing pain.

## 2016-07-29 NOTE — Progress Notes (Signed)
Pt in for repeat hcg level. She requested to speak to a nurse. She states that she had heavy red bleeding last week and it turned to brown. Now she has a foul odor that smells like something died. I spoke with Judeth HornErin Lawrence, NP and she recommended that patient go to MAU to be evaluated. Pt is agreeable, She was walked up to MAU by staff member.

## 2016-07-29 NOTE — Discharge Instructions (Signed)
Aborto espontáneo °(Miscarriage) °El aborto espontáneo es la pérdida de un bebé que no ha nacido.(feto) antes de la semana 20 del embarazo. La causa generalmente es desconocida. °CUIDADOS EN EL HOGAR °· Debe permanecer en cama (reposo en cama) o podrá hacer actividades livianas. Regrese a sus actividades según las indicaciones del médico. °· Pida ayuda con las tareas domésticas. °· Anote cuántos apósitos usa por día. Describa el grado en que están empapados. °· No use tampones. No se higienice la vagina (duchas vaginales) ni tenga relaciones sexuales (coito) hasta que el médico la autorice. °· Sólo debe tomar la medicación según las indicaciones del médico. °· No tome aspirina. °· Cumpla con los controles médicos según las indicaciones. °· Si usted o su pareja tienen problemas con el duelo, hable con su médico. También puede intentar con psicoterapia. Permítase el tiempo suficiente de duelo antes de quedar embarazada nuevamente. ° °SOLICITE AYUDA DE INMEDIATO SI: °· Siente cólicos intensos o dolor en el estómago, en la espalda o en el vientre (abdomen). °· Tiene fiebre. °· Elimina grumos de sangre (coágulos) por la vagina, que tienen el tamaño de una nuez o más. Guarde los coágulos para que el médico los vea. °· Elimina gran cantidad de tejidos por la vagina. Guarde lo que ha eliminado para que su médico lo examine. °· Aumenta el sangrado. °· Observa una secreción espesa, con mal olor (pérdida) que proviene de la vagina. °· Se siente mareada, débil o se desvanece (se desmaya). °· Siente escalofríos. ° °ASEGÚRESE DE QUE: °· Comprende estas instrucciones. °· Controlará su enfermedad. °· Solicitará ayuda de inmediato si no mejora o si empeora. ° °Esta información no tiene como fin reemplazar el consejo del médico. Asegúrese de hacerle al médico cualquier pregunta que tenga. °Document Released: 12/02/2011 Document Revised: 12/02/2011 Document Reviewed: 07/03/2011 °Elsevier Interactive Patient Education © 2017 [employer] ° °

## 2016-07-29 NOTE — MAU Provider Note (Signed)
History     CSN: [id number]  Arrival date and time: 07/29/16 0840   First Provider Initiated Contact with Patient 07/29/16 [id number]      Chief Complaint  Patient presents with  . Abdominal Pain  . Vaginal Bleeding   Patient is a 31 year old G6 P2 who was diagnosed with a spontaneous miscarriage on February 5. She reports immediately following that date she had several days of heavy bleeding which tapered off. 3 days ago she started to develop severe abdominal pain and very foul-smelling discharge. She reports continuing to have coffee-ground-like bleeding. She has had 3 previous miscarriages and said she never had foul-smelling discharge like this previously. She denies fevers or chills she does have a temperature to 99 here. She has no other concerns or worries.    OB History    Gravida Para Term Preterm AB Living   6 2 2   4 2    SAB TAB Ectopic Multiple Live Births   3   1   2       Obstetric Comments   D&E with first SAB, MTX x2      Past Medical History:  Diagnosis Date  . Medical history non-contributory   . Other ectopic pregnancy without intrauterine pregnancy 10/27/2012    Past Surgical History:  Procedure Laterality Date  . DILATION AND CURETTAGE OF UTERUS      Family History  Problem Relation Age of Onset  . Cancer Paternal Grandfather   . Asthma Neg Hx   . Diabetes Neg Hx   . Heart disease Neg Hx   . Hypertension Neg Hx   . Stroke Neg Hx     Social History  Substance Use Topics  . Smoking status: Never Smoker  . Smokeless tobacco: Never Used  . Alcohol use No    Allergies: No Known Allergies  Prescriptions Prior to Admission  Medication Sig Dispense Refill Last Dose  . acetaminophen (TYLENOL) 500 MG tablet Take 1 tablet (500 mg total) by mouth every 6 (six) hours as needed. (Patient not taking: Reported on 07/21/2016) 30 tablet 0 Not Taking at Unknown time  . ibuprofen (ADVIL,MOTRIN) 600 MG tablet Take 1 tablet (600 mg total) by mouth every 6 (six)  hours as needed. 30 tablet 0   . oxyCODONE-acetaminophen (PERCOCET/ROXICET) 5-325 MG tablet Take 1-2 tablets by mouth every 4 (four) hours as needed for severe pain. 10 tablet 0   . Prenatal Vit-Fe Fumarate-FA (PRENATAL MULTIVITAMIN) TABS tablet Take 1 tablet by mouth daily at 12 noon.   07/20/2016 at Unknown time  . prenatal vitamin w/FE, FA (NATACHEW) 29-1 MG CHEW chewable tablet Chew 1 tablet by mouth daily at 12 noon. (Patient not taking: Reported on 07/21/2016) 30 tablet 6 Not Taking at Unknown time    Review of Systems  Constitutional: Negative for chills and fever.  HENT: Negative for congestion and rhinorrhea.   Respiratory: Negative for cough and shortness of breath.   Cardiovascular: Negative for chest pain and palpitations.  Gastrointestinal: Positive for abdominal distention. Negative for blood in stool, constipation, diarrhea, nausea and vomiting.  Genitourinary: Negative for difficulty urinating, dysuria and frequency.  Neurological: Negative for dizziness and weakness.   Physical Exam   Blood pressure (!) 93/53, pulse 82, temperature 99 F (37.2 C), temperature source Oral, resp. rate 16, height 5\' 2"  (1.575 m), weight 139 lb 0.6 oz (63.1 kg), SpO2 99 %, unknown if currently breastfeeding.  Physical Exam  Constitutional: She is oriented to person, place, and time. She appears  well-developed and well-nourished.  HENT:  Head: Normocephalic and atraumatic.  Cardiovascular: Normal rate, regular rhythm and normal heart sounds.  Exam reveals no gallop and no friction rub.   No murmur heard. Respiratory: Effort normal and breath sounds normal. No respiratory distress. She has no wheezes.  GI: Soft. Bowel sounds are normal. She exhibits no distension. There is tenderness. There is no rebound and no guarding.  Moderate abdominal tenderness in the bilateral lower quadrants  Genitourinary:  Genitourinary Comments: Patient with what appears 3 products of conception at the cervical os.  Cervix remains open. Significant foul-smelling discharge noted. No bright red blood. Products of conception removed with ring forceps and sent to pathology.  Neurological: She is alert and oriented to person, place, and time. No cranial nerve deficit.  Skin: Skin is warm and dry.  Psychiatric: She has a normal mood and affect. Her behavior is normal.    MAU Course  Procedures  MDM In MA U patient underwent evaluation which included -Transvaginal ultrasound which revealed no remaining products of conception in the uterus -CBC which revealed no elevated white blood cell count but did reveal anemia to 7.5. Her previous hemoglobin was 11. Patient will be started on iron supplementation.  Proximal of conception were removed from the cervical os. Discussed case with Dr. Jolayne Panther who recommended if ultrasound reviewed no retained products of conception treat for outpatient PID. Assessment and Plan  #1: Completed abortion, patient likely had some retained products of conception in the lower uterine segment/cervix. Removed today in MA U. Given foul-smelling discharge and increasing pain will treat with Rocephin IM and doxycycline 2 weeks. Patient voiced understanding with plan.  Ernestina Penna 07/29/2016, 10:50 AM

## 2016-07-29 NOTE — MAU Note (Signed)
Patient states she is being followed for HCG levels following miscarriage last week.

## 2016-07-30 LAB — HCG, QUANTITATIVE, PREGNANCY: HCG, BETA CHAIN, QUANT, S: 165.4 m[IU]/mL — AB

## 2016-08-13 NOTE — Telephone Encounter (Signed)
Open in error

## 2017-06-16 NOTE — L&D Delivery Note (Signed)
Patient is 32 y.o. [id number] 3219w3d admitted on 8/8 for SOL. Prenatal course complicated by GBS positive.   Delivery Note At 7:42 AM a viable female was delivered via Vaginal, Spontaneous (Presentation: cephalic; LOA  ).  APGAR: 9, 9; weight pending .   Placenta status: intact, marginal, possibly villamentous cord . 3 vessel Cord:     Head delivered LOA. Double nuchal cord present. Reduced after delivery of baby. Shoulder and body delivered in usual fashion. Infant with spontaneous cry, placed on mother's abdomen, dried and bulb suctioned. Cord clamped x 2 after 1-minute delay, and cut by family member. Cord blood drawn. Placenta delivered spontaneously with gentle cord traction. Fundus firm with massage and Pitocin. Perineum inspected and found to have no lacerations.   Anesthesia:  none Episiotomy: None Lacerations: None Suture Repair: n/a Est. Blood Loss (mL): 200  Mom to postpartum.  Baby to Couplet care / Skin to Skin.  Lindsay Gutierrez 01/21/2018, 8:07 AM

## 2017-08-13 LAB — OB RESULTS CONSOLE HEPATITIS B SURFACE ANTIGEN: Hepatitis B Surface Ag: NEGATIVE

## 2017-08-13 LAB — OB RESULTS CONSOLE HIV ANTIBODY (ROUTINE TESTING): HIV: NONREACTIVE

## 2017-08-13 LAB — OB RESULTS CONSOLE GC/CHLAMYDIA
Chlamydia: NEGATIVE
Gonorrhea: NEGATIVE

## 2017-08-13 LAB — OB RESULTS CONSOLE RUBELLA ANTIBODY, IGM: Rubella: IMMUNE

## 2018-01-21 ENCOUNTER — Inpatient Hospital Stay (HOSPITAL_COMMUNITY)
Admission: AD | Admit: 2018-01-21 | Discharge: 2018-01-23 | DRG: 807 | Disposition: A | Discharge status: Home / Self Care | Payer: Medicaid Other | Attending: Obstetrics and Gynecology | Admitting: Obstetrics and Gynecology

## 2018-01-21 ENCOUNTER — Encounter (HOSPITAL_COMMUNITY): Payer: Self-pay

## 2018-01-21 DIAGNOSIS — O99824 Streptococcus B carrier state complicating childbirth: Secondary | ICD-10-CM | POA: Diagnosis present

## 2018-01-21 DIAGNOSIS — Z3A37 37 weeks gestation of pregnancy: Secondary | ICD-10-CM

## 2018-01-21 DIAGNOSIS — Z3483 Encounter for supervision of other normal pregnancy, third trimester: Secondary | ICD-10-CM | POA: Diagnosis present

## 2018-01-21 HISTORY — DX: Supervision of pregnancy with other poor reproductive or obstetric history, second trimester: O09.292

## 2018-01-21 HISTORY — DX: Low lying placenta nos or without hemorrhage, second trimester: O44.42

## 2018-01-21 HISTORY — DX: Maternal care for other (suspected) fetal abnormality and damage, not applicable or unspecified: O35.8XX0

## 2018-01-21 LAB — OB RESULTS CONSOLE GBS: GBS: POSITIVE

## 2018-01-21 MED ORDER — DIBUCAINE 1 % RE OINT: 1.0000 "application " | TOPICAL_OINTMENT | RECTAL | Status: DC | PRN

## 2018-01-21 MED ORDER — PRENATAL MULTIVITAMIN CH
1.0000 | ORAL_TABLET | Freq: Every day | ORAL | Status: DC
  Administered 2018-01-21 – 2018-01-22 (×2): 1 via ORAL
  Filled 2018-01-21 (×2): qty 1

## 2018-01-21 MED ORDER — LACTATED RINGERS IV SOLN: 500.0000 mL | Freq: Once | INTRAVENOUS | Status: DC

## 2018-01-21 MED ORDER — TETANUS-DIPHTH-ACELL PERTUSSIS 5-2.5-18.5 LF-MCG/0.5 IM SUSP: 0.5000 mL | Freq: Once | INTRAMUSCULAR | Status: DC

## 2018-01-21 MED ORDER — EPHEDRINE 5 MG/ML INJ
10.0000 mg | INTRAVENOUS | Status: DC | PRN
  Filled 2018-01-21: qty 2

## 2018-01-21 MED ORDER — DIPHENHYDRAMINE HCL 50 MG/ML IJ SOLN: 12.5000 mg | INTRAMUSCULAR | Status: DC | PRN

## 2018-01-21 MED ORDER — OXYTOCIN 40 UNITS IN LACTATED RINGERS INFUSION - SIMPLE MED
2.5000 [IU]/h | INTRAVENOUS | Status: DC
  Filled 2018-01-21: qty 1000

## 2018-01-21 MED ORDER — ONDANSETRON HCL 4 MG/2ML IJ SOLN: 4.0000 mg | INTRAMUSCULAR | Status: DC | PRN

## 2018-01-21 MED ORDER — WITCH HAZEL-GLYCERIN EX PADS: 1.0000 "application " | MEDICATED_PAD | CUTANEOUS | Status: DC | PRN

## 2018-01-21 MED ORDER — ONDANSETRON HCL 4 MG PO TABS: 4.0000 mg | ORAL_TABLET | ORAL | Status: DC | PRN

## 2018-01-21 MED ORDER — SOD CITRATE-CITRIC ACID 500-334 MG/5ML PO SOLN: 30.0000 mL | ORAL | Status: DC | PRN

## 2018-01-21 MED ORDER — IBUPROFEN 600 MG PO TABS
600.0000 mg | ORAL_TABLET | Freq: Four times a day (QID) | ORAL | Status: DC
  Administered 2018-01-21 – 2018-01-23 (×8): 600 mg via ORAL
  Filled 2018-01-21 (×8): qty 1

## 2018-01-21 MED ORDER — COCONUT OIL OIL: 1.0000 "application " | TOPICAL_OIL | Status: DC | PRN

## 2018-01-21 MED ORDER — DIPHENHYDRAMINE HCL 25 MG PO CAPS
25.0000 mg | ORAL_CAPSULE | Freq: Four times a day (QID) | ORAL | Status: DC | PRN
Start: 2018-01-21 — End: 2018-01-23

## 2018-01-21 MED ORDER — SENNOSIDES-DOCUSATE SODIUM 8.6-50 MG PO TABS
2.0000 | ORAL_TABLET | ORAL | Status: DC
  Administered 2018-01-21 – 2018-01-23 (×2): 2 via ORAL
  Filled 2018-01-21 (×2): qty 2

## 2018-01-21 MED ORDER — FENTANYL CITRATE (PF) 100 MCG/2ML IJ SOLN
INTRAMUSCULAR | Status: AC
  Filled 2018-01-21: qty 2

## 2018-01-21 MED ORDER — PHENYLEPHRINE 40 MCG/ML (10ML) SYRINGE FOR IV PUSH (FOR BLOOD PRESSURE SUPPORT)
80.0000 ug | PREFILLED_SYRINGE | INTRAVENOUS | Status: DC | PRN
  Filled 2018-01-21: qty 5

## 2018-01-21 MED ORDER — PENICILLIN G POTASSIUM 5000000 UNITS IJ SOLR
5.0000 10*6.[IU] | Freq: Once | INTRAMUSCULAR | Status: AC
  Administered 2018-01-21: 5 10*6.[IU] via INTRAVENOUS
  Filled 2018-01-21: qty 5

## 2018-01-21 MED ORDER — OXYCODONE-ACETAMINOPHEN 5-325 MG PO TABS: 2.0000 | ORAL_TABLET | ORAL | Status: DC | PRN

## 2018-01-21 MED ORDER — BENZOCAINE-MENTHOL 20-0.5 % EX AERO: 1.0000 "application " | INHALATION_SPRAY | CUTANEOUS | Status: DC | PRN

## 2018-01-21 MED ORDER — FENTANYL CITRATE (PF) 100 MCG/2ML IJ SOLN: 100.0000 ug | INTRAMUSCULAR | Status: DC | PRN

## 2018-01-21 MED ORDER — ONDANSETRON HCL 4 MG/2ML IJ SOLN: 4.0000 mg | Freq: Four times a day (QID) | INTRAMUSCULAR | Status: DC | PRN

## 2018-01-21 MED ORDER — PHENYLEPHRINE 40 MCG/ML (10ML) SYRINGE FOR IV PUSH (FOR BLOOD PRESSURE SUPPORT)
80.0000 ug | PREFILLED_SYRINGE | INTRAVENOUS | Status: DC | PRN
  Filled 2018-01-21: qty 10
  Filled 2018-01-21: qty 5

## 2018-01-21 MED ORDER — OXYCODONE-ACETAMINOPHEN 5-325 MG PO TABS: 1.0000 | ORAL_TABLET | ORAL | Status: DC | PRN

## 2018-01-21 MED ORDER — FENTANYL 2.5 MCG/ML BUPIVACAINE 1/10 % EPIDURAL INFUSION (WH - ANES)
14.0000 mL/h | INTRAMUSCULAR | Status: DC | PRN
  Filled 2018-01-21: qty 100

## 2018-01-21 MED ORDER — ZOLPIDEM TARTRATE 5 MG PO TABS: 5.0000 mg | ORAL_TABLET | Freq: Every evening | ORAL | Status: DC | PRN

## 2018-01-21 MED ORDER — ACETAMINOPHEN 325 MG PO TABS: 650.0000 mg | ORAL_TABLET | ORAL | Status: DC | PRN

## 2018-01-21 MED ORDER — SIMETHICONE 80 MG PO CHEW
80.0000 mg | CHEWABLE_TABLET | ORAL | Status: DC | PRN
  Administered 2018-01-21: 80 mg via ORAL
  Filled 2018-01-21: qty 1

## 2018-01-21 MED ORDER — LACTATED RINGERS IV SOLN: 500.0000 mL | INTRAVENOUS | Status: DC | PRN

## 2018-01-21 MED ORDER — ACETAMINOPHEN 325 MG PO TABS
650.0000 mg | ORAL_TABLET | ORAL | Status: DC | PRN
  Administered 2018-01-21 (×2): 650 mg via ORAL
  Filled 2018-01-21 (×2): qty 2

## 2018-01-21 MED ORDER — LACTATED RINGERS IV SOLN
INTRAVENOUS | Status: DC
  Administered 2018-01-21: 06:00:00 via INTRAVENOUS

## 2018-01-21 MED ORDER — LIDOCAINE HCL (PF) 1 % IJ SOLN
30.0000 mL | INTRAMUSCULAR | Status: DC | PRN
  Filled 2018-01-21: qty 30

## 2018-01-21 MED ORDER — OXYTOCIN BOLUS FROM INFUSION
500.0000 mL | Freq: Once | INTRAVENOUS | Status: AC
  Administered 2018-01-21: 500 mL/h via INTRAVENOUS

## 2018-01-21 MED ORDER — PENICILLIN G POT IN DEXTROSE 60000 UNIT/ML IV SOLN
3.0000 10*6.[IU] | INTRAVENOUS | Status: DC
  Filled 2018-01-21: qty 50

## 2018-01-21 NOTE — H&P (Addendum)
OBSTETRIC ADMISSION HISTORY AND PHYSICAL  Lindsay Gutierrez is a 32 y.o. female [phone] with IUP at 6642w3d by LMP presenting for SOL. She reports +FMs, No LOF, no VB, no blurry vision, headaches or peripheral edema, and no RUQ pain.  She plans on breast and bottle feeding. She request depo shot for birth control. [facility]   Sono:    @[redacted]w[redacted]d , CWD, normal anatomy, breech presentation,  351g, 68.8% EFW   Prenatal History/Complications:  Past Medical History: Past Medical History:  Diagnosis Date  . Medical history non-contributory   . Other ectopic pregnancy without intrauterine pregnancy 10/27/2012    Past Surgical History: Past Surgical History:  Procedure Laterality Date  . DILATION AND CURETTAGE OF UTERUS      Obstetrical History: OB History    Gravida  7   Para  2   Term  2   Preterm      AB  4   Living  2     SAB  3   TAB      Ectopic  1   Multiple      Live Births  2        Obstetric Comments  D&E with first SAB, MTX x2        Social History: Social History   Socioeconomic History  . Marital status: Single    Spouse name: Not on file  . Number of children: Not on file  . Years of education: Not on file  . Highest education level: Not on file  Occupational History  . Not on file  Social Needs  . Financial resource strain: Not on file  . Food insecurity:    Worry: Not on file    Inability: Not on file  . Transportation needs:    Medical: Not on file    Non-medical: Not on file  Tobacco Use  . Smoking status: Never Smoker  . Smokeless tobacco: Never Used  Substance and Sexual Activity  . Alcohol use: No  . Drug use: No  . Sexual activity: Yes    Birth control/protection: None  Lifestyle  . Physical activity:    Days per week: Not on file    Minutes per session: Not on file  . Stress: Not on file  Relationships  . Social connections:    Talks on phone: Not on file    Gets together: Not on file   Attends religious service: Not on file    Active member of club or organization: Not on file    Attends meetings of clubs or organizations: Not on file    Relationship status: Not on file  Other Topics Concern  . Not on file  Social History Narrative  . Not on file    Family History: Family History  Problem Relation Age of Onset  . Cancer Paternal Grandfather   . Asthma Neg Hx   . Diabetes Neg Hx   . Heart disease Neg Hx   . Hypertension Neg Hx   . Stroke Neg Hx     Allergies: No Known Allergies  Medications Prior to Admission  Medication Sig Dispense Refill Last Dose  . acetaminophen (TYLENOL) 500 MG tablet Take 1 tablet (500 mg total) by mouth every 6 (six) hours as needed. (Patient not taking: Reported on 07/21/2016) 30 tablet 0 More than a month at Unknown time  . ferrous sulfate 325 (65 FE) MG EC tablet Take 1 tablet (325 mg total) by mouth 2 (two)  times daily. 60 tablet 1   . ibuprofen (ADVIL,MOTRIN) 600 MG tablet Take 1 tablet (600 mg total) by mouth every 6 (six) hours as needed. 30 tablet 0 More than a month at Unknown time  . oxyCODONE-acetaminophen (PERCOCET/ROXICET) 5-325 MG tablet Take 1-2 tablets by mouth every 4 (four) hours as needed for severe pain. 10 tablet 0 More than a month at Unknown time  . Prenatal Vit-Fe Fumarate-FA (PRENATAL MULTIVITAMIN) TABS tablet Take 1 tablet by mouth daily at 12 noon.   More than a month at Unknown time     Review of Systems   All systems reviewed and negative except as stated in HPI  Blood pressure 113/73, pulse 72, temperature 97.9 F (36.6 C), resp. rate 19, unknown if currently breastfeeding. General appearance: alert, appears stated age and moderate distress Lungs: clear to auscultation bilaterally Heart: regular rate and rhythm Abdomen: soft, non-tender; bowel sounds normal Extremities: Homans sign is negative, no sign of DVT Presentation: cephalic Fetal monitoringBaseline: 120 bpm, Variability: Good {> 6 bpm) and  Accelerations: Reactive Uterine activityDate/time of onset: 8/7, Frequency: Every 3 minutes and Intensity: moderate Dilation: 6 Effacement (%): 80 Station: -2 Exam by:: Latricia Heft, RN   Prenatal labs: ABO, Rh:  O pos Antibody:  neg Rubella:  immune RPR:   neg HBsAg:   nonreactive HIV:   nonreactive GBS:   positive 1 hr Glucola 60 Genetic screening  Quad negative Anatomy US normal  Prenatal Transfer Tool  Maternal Diabetes: No Genetic Screening: Normal Maternal Ultrasounds/Referrals: Normal Fetal Ultrasounds or other Referrals:  None Maternal Substance Abuse:  No Significant Maternal Medications:  None Significant Maternal Lab Results: Lab values include: Group B Strep positive  No results found for this or any previous visit (from the past 24 hour(s)).  Patient Active Problem List   Diagnosis Date Noted  . Normal delivery 03/07/2014  . Need for Tdap vaccination 12/29/2013  . Low-lying posterior placenta, antepartum 10/25/2013  . 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 Low birth weigh infant in prior pregnancy (5 lbs 6 oz at 39 weeks), currently pregnant 09/27/2013  . Language barrier, speaks Spanish only 09/27/2013    Assessment/Plan:  Lindsay Gutierrez is a 32 y.o. [id number] at [redacted]w[redacted]d here for SOL.  She developed contractions last night at 11pm and were painful and occurring ever 3-5 minutes in the MAU.    #Labor:expectant management #Pain: patient requests epidural #FWB: Cat. I #ID:  Group B strep positive - penicillin ordered #MOF: breast and bottle #MOC:depo shot #Circ:  N/a.   Sandre Kitty, MD  01/21/2018, 7:06 AM  CNM attestation:  I have seen and examined this patient; I agree with above documentation in the resident's note.   Lindsay Gutierrez is a 31 y.o. [id number] here for SOL.  PE: BP 130/88   Pulse 69   Temp 97.9 F (36.6 C)   Resp 16   Breastfeeding? Unknown   Resp: normal effort, no  distress Abd: gravid  ROS, labs, PMH reviewed  Plan: Admit to [facility] Expectant management Anticipate SVD  Cam Hai CNM 01/21/2018, 10:05 AM

## 2018-01-21 NOTE — Lactation Note (Signed)
This note was copied from a baby's chart. Lactation Consultation Note  Patient Name: Lindsay Blondell Revealeresa Ketterman RUEAV'WToday's Date: 01/21/2018 Reason for consult: Early term 37-38.6wks;Difficult latch;Initial assessment P3, 13 hour female ETI. Mom speaks English, doesn't want interpreter. Per mom her feeding plan is to breast and bottle feed her baby. Discussed w/ parents,  "Caring for your ETI " That supplementation may be need EBM/ formula. [facility] [place] per mom, she attended BF classes in her pregnancy. Mom has short shaft, semi-flat, nipples, NS given by LC size 20 mm. Mom been given Breast shells by RN and knows how to use them. Discussed pre-pumping before latching infant to breast due short shaft / semi-flat nipples. Mom demonstrated hand expression and colostrum was expressed from breast. Mom wanted used scissor  hand position and infant mouth was not opened wide, infant only latched at the tip of the nipple. LC assisted mom w/latching infant to left breast using the cross cradle position w/out NS at first. Mom reposition her hand in" C-hold" position, rubbing breast below nose of infant's mouth, infant open mouth with wide gape , would suckle few minutes and  repeatedly release breast.  Mom fitted w/ 20 mm NS, infant opened mouth w/ wide gape, lower jaw extended,  and  Infant sustained latch w/out releasing breast, swallows heard by LC. Mom breast feed for 8-10 minutes , then burped baby  Mom was doing STS as LC left room infant still cuing, Mom plans to put baby back to breast .  LC encouraged mom to feeding according to cuing, 8-12 times w/ 24 hours including nights. Mom knows to pump q3h for 15-20 min. Mom shown how to use DEBP & how to disassemble, clean, & reassemble parts. Reviewed Baby & Me book's Breastfeeding Basics.  Mom made aware of O/P services, breastfeeding support groups, community resources, and our phone # for post-discharge questions.  Maternal Data Formula  Feeding for Exclusion: No Has patient been taught Hand Expression?: Yes Does the patient have breastfeeding experience prior to this delivery?: (LC taught hand  expression.)  Feeding Feeding Type: Breast Fed Nipple Type: Slow - flow  LATCH Score Latch: Repeated attempts needed to sustain latch, nipple held in mouth throughout feeding, stimulation needed to elicit sucking reflex.  Audible Swallowing: A few with stimulation  Type of Nipple: Flat  Comfort (Breast/Nipple): Soft / non-tender  Hold (Positioning): Assistance needed to correctly position infant at breast and maintain latch.  LATCH Score: 6  Interventions Interventions: Breast feeding basics reviewed;Assisted with latch;Skin to skin;Breast massage;Hand express;Pre-pump if needed;Support pillows;Position options;Hand pump;DEBP;Adjust position;Breast compression;Expressed milk  Lactation Tools Discussed/Used WIC Program: Yes Pump Review: Setup, frequency, and cleaning;Milk Storage Initiated by:: Lindsay Gutierrez, IBCLC Date initiated:: 01/21/18   Consult Status Consult Status: Follow-up Date: 01/22/18 Follow-up type: In-patient    Lindsay EarthlyRobin Robyn Nohr 01/21/2018, 9:14 PM

## 2018-01-21 NOTE — Anesthesia Pain Management Evaluation Note (Signed)
  CRNA Pain Management Visit Note  Patient: Lindsay Gutierrez, 32 y.o., female  "Hello I am a member of the anesthesia team at [facility]. We have an anesthesia team available at all times to provide care throughout the hospital, including epidural management and anesthesia for C-section. I don't know your plan for the delivery whether it a natural birth, water birth, IV sedation, nitrous supplementation, doula or epidural, but we want to meet your pain goals."   1.Was your pain managed to your expectations on prior hospitalizations?   Yes   2.What is your expectation for pain management during this hospitalization?     epidural  3.How can we help you reach that goal? epidural  Record the patient's initial score and the patient's pain goal.   Pain: 9/10  Pain Goal: 0/10 The [facility] wants you to be able to say your pain was always managed very well.  Salome ArntSterling, Dekisha Mesmer Marie 01/21/2018

## 2018-01-21 NOTE — Progress Notes (Signed)
Check on pt needs, ordered meals, by Orlan LeavensViria Alvarez Spanish Medical Interpreter.

## 2018-01-22 ENCOUNTER — Other Ambulatory Visit: Payer: Self-pay

## 2018-01-22 ENCOUNTER — Encounter (HOSPITAL_COMMUNITY): Payer: Self-pay

## 2018-01-22 DIAGNOSIS — O99824 Streptococcus B carrier state complicating childbirth: Secondary | ICD-10-CM

## 2018-01-22 DIAGNOSIS — Z3A37 37 weeks gestation of pregnancy: Secondary | ICD-10-CM

## 2018-01-22 LAB — RPR: RPR Ser Ql: NONREACTIVE

## 2018-01-22 NOTE — Progress Notes (Signed)
Post Partum Day 1 Subjective: Lindsay Gutierrez is doing well with no complaints. She is both breast and bottle feeding and plans for Depo for contraception. She has passed flatus but has not had a bowel movement since delivery. She endorses general abdominal and lower back soreness. She denies headache, changes in vision, RUQ pain, SOB, chest pain, or LE swelling.   Objective: Blood pressure 107/68, pulse 66, temperature 97.9 F (36.6 C), temperature source Oral, resp. rate 18, SpO2 100 %, unknown if currently breastfeeding.  Physical Exam:  General: alert and no distress Lochia: appropriate Uterine Fundus: firm Incision: n/a DVT Evaluation: No evidence of DVT seen on physical exam.  No results for input(s): HGB, HCT in the last 72 hours.  Assessment/Plan: Lindsay Gutierrez is a 32 year-old G7P3003 who is PPD #1 from SVD. She is in good condition and has had an uncomplicated admission. Patient met with lactation yesterday and will meet with them again today. Plan for discharge tomorrow.   LOS: 1 day   Isabel Caprice 01/22/2018, 7:35 AM    OB FELLOW MEDICAL STUDENT NOTE ATTESTATION  I confirm that I have verified the information documented in the medical student's note and that I have also personally performed the physical exam and all medical decision making activities.   Lindsay Gutierrez is 32 y.o. [phone] female PPD #1 from [facility]. Discussed with patient that due to GBS+ status with inadequate prophylaxis, that baby will likely need to stay until tomorrow AM for monitoring. Patient reports feeling well. Ambulating without difficulty, voiding normally, lochia is mild, pain is well controlled.Vital signs stable. Physical exam benign with firm uterine fundus and no LE edema.  Patient is breast and bottle feeding. Plans for DepoProvera for contraception. Plan for d/c tomorrow.   Phill Myron, D.O. OB Fellow  01/22/2018, 9:44 AM

## 2018-01-22 NOTE — Discharge Instructions (Signed)
Parto vaginal, cuidados posteriores  Vaginal Delivery, Care After  Siga estas instrucciones durante las prximas semanas. Estas indicaciones le proporcionan informacin acerca de cmo deber cuidarse despus del parto vaginal. Su mdico tambin podr darle indicaciones ms especficas. El tratamiento ha sido planificado segn las prcticas mdicas actuales, pero en algunos casos pueden ocurrir problemas. Llame al mdico si tiene problemas o preguntas.  Qu puedo esperar despus del procedimiento?  Despus de un parto vaginal, es frecuente tener lo siguiente:   Hemorragia leve de la vagina.   Dolor en el abdomen, la vagina y la zona de la piel entre la abertura vaginal y el ano (perineo).   Calambres plvicos.   Fatiga.    Siga estas indicaciones en su casa:  Medicamentos   Tome los medicamentos de venta libre y los recetados solamente como se lo haya indicado el mdico.   Si le recetaron un antibitico, tmelo como se lo haya indicado el mdico. No interrumpa la administracin del antibitico hasta que lo haya terminado.  Conducir     No conduzca ni opere maquinaria pesada mientras toma analgsicos recetados.   No conduzca durante 24horas si le administraron un sedante.  Estilo de vida   No beba alcohol. Esto es de suma importancia si est amamantando o toma analgsicos.   No consuma productos que contengan tabaco, incluidos cigarrillos, tabaco de mascar o cigarrillos electrnicos. Si necesita ayuda para dejar de fumar, consulte al mdico.  Qu debe comer y beber   Beba al menos 8vasos de ochoonzas (240cc) de agua todos los das a menos que el mdico le indique lo contrario. Si elige amamantar al beb, quiz deba beber an ms cantidad de agua.   Coma alimentos ricos en fibras todos los das. Estos alimentos pueden ayudarla a prevenir o aliviar el estreimiento. Los alimentos ricos en fibras incluyen, entre otros:  ? Panes y cereales integrales.  ? Arroz integral.  ? Frijoles.  ? Frutas y verduras  frescas.  Actividad   Retome sus actividades normales como se lo haya indicado el mdico. Pregntele al mdico qu actividades son seguras para usted.   Descanse todo lo que pueda. Trate de descansar o tomar una siesta mientras el beb est durmiendo.   No levante objetos que pesen ms que su beb o 10libras (4,5kg) hasta que el mdico le diga que es seguro.   Hable con el mdico sobre cundo puede retomar la actividad sexual. Esto puede depender de lo siguiente:  ? Riesgo de sufrir una infeccin.  ? Velocidad de cicatrizacin.  ? Comodidad y deseo de retomar la actividad sexual.  Cuidados vaginales   Si le realizaron una episiotoma o tuvo un desgarro vaginal, contrlese la zona todos los das para detectar signos de infeccin. Est atenta a los siguientes signos:  ? Aumento del enrojecimiento, la hinchazn o el dolor.  ? Mayor presencia de lquido o sangre.  ? Calor.  ? Pus o mal olor.   No use tampones ni se haga duchas vaginales hasta que el mdico la autorice.   Controle la sangre que elimina por la vagina para detectar cogulos de sangre. Estos pueden tener el aspecto de grumos de color rojo oscuro, o secrecin marrn o negra.  Instrucciones generales   Mantenga el perineo limpio y seco, como se lo haya indicado el mdico.   Use ropa cmoda y suelta.   Cuando vaya al bao, siempre higiencese de adelante hacia atrs.   Pregntele al mdico si puede ducharse o tomar baos de inmersin.   Si se le realiz una episiotoma o tuvo un desgarro perineal durante el trabajo del parto o el parto, es posible que el mdico le indique que no tome baos de inmersin durante un determinado tiempo.   Use un sostn que sujete y ajuste bien sus pechos.   Si es posible, pdale a alguien que la ayude con las tareas del hogar y a cuidar del beb durante al menos algunos das despus de que le den el alta del hospital.   Concurra a todas las visitas de seguimiento para usted y el beb, como se lo haya indicado el  mdico. Esto es importante.  Comunquese con un mdico si:   Tiene los siguientes sntomas:  ? Secrecin vaginal que tiene mal olor.  ? Dificultad para orinar.  ? Dolor al orinar.  ? Aumento o disminucin repentinos de la frecuencia de las deposiciones.  ? Ms enrojecimiento, hinchazn o dolor alrededor de la episiotoma o del desgarro vaginal.  ? Ms secrecin de lquido o sangre de la episiotoma o del desgarro vaginal.  ? Pus o mal olor proveniente de la episiotoma o del desgarro vaginal.  ? Fiebre.  ? Erupcin cutnea.  ? Poco inters o falta de inters en actividades que solan gustarle.  ? Dudas sobre su cuidado y el del beb.   Siente la episiotoma o el desgarro vaginal caliente al tacto.   La episiotoma o el desgarro vaginal se abren o no parecen cicatrizar.   Siente dolor en las mamas, o estn duras o enrojecidas.   Siente tristeza o preocupacin de forma inusual.   Siente nuseas o vomita.   Elimina cogulos de sangre grandes por la vagina. Si expulsa un cogulo de sangre por la vagina, gurdelo para mostrrselo a su mdico. No tire la cadena sin que el mdico examine el cogulo de sangre antes.   Orina ms de lo habitual.   Se siente mareada o se desmaya.   No ha amamantado para nada y no ha tenido un perodo menstrual durante 12 semanas despus del parto.   Dej de amamantar al beb y no ha tenido su perodo menstrual durante 12 semanas despus de dejar de amamantar.  Solicite ayuda de inmediato si:   Tiene los siguientes sntomas:  ? Dolor que no desaparece o no mejora con medicamentos.  ? Dolor en el pecho.  ? Dificultad para respirar.  ? Visin borrosa o manchas en la vista.  ? Pensamientos de autolesionarse o lesionar al beb.   Comienza a sentir dolor en el abdomen o en una de las piernas.   Presenta un dolor de cabeza intenso.   Se desmaya.   Tiene una hemorragia de la vagina tan intensa que empapa dos toallitas sanitarias en una hora.  Esta informacin no tiene como fin  reemplazar el consejo del mdico. Asegrese de hacerle al mdico cualquier pregunta que tenga.  Document Released: 06/02/2005 Document Revised: 09/24/2016 Document Reviewed: 06/17/2015  Elsevier Interactive Patient Education  2018 [employer]vier Inc[employer]

## 2018-01-22 NOTE — Lactation Note (Signed)
This note was copied from a baby's chart. Lactation Consultation Note  Patient Name: Lindsay Gutierrez ZOXWR'UToday's Date: 01/22/2018   Spoke with P3 Mom of 1578w3d baby at 630 hrs old, and at 2% weight loss..  Baby sleeping swaddled in crib after recently haven been fed formula by bottle.  Mom has latched baby once for 10 mins today.  Mom states she has pumped 3 times.  Pump parts cleaned and drying in basin.    Recommended keeping baby STS, and feeding baby on the breast when she cues prior to offering supplement.  Goal is to feed baby >8 times per 24 hrs.  Offered to assist at next feeding at the breast.  Encouraged Mom to call at next feeding.    Judee ClaraSmith, Nickia Boesen E 01/22/2018, 2:23 PM

## 2018-01-22 NOTE — Plan of Care (Signed)
Parents and baby progressing as expected 

## 2018-01-23 ENCOUNTER — Encounter (HOSPITAL_COMMUNITY): Payer: Self-pay | Admitting: Advanced Practice Midwife

## 2018-01-23 MED ORDER — IBUPROFEN 600 MG PO TABS: 600.0000 mg | ORAL_TABLET | Freq: Four times a day (QID) | ORAL | 0 refills | Status: DC | PRN

## 2018-01-23 NOTE — Discharge Summary (Signed)
OB Discharge Summary     Patient Name: Lindsay Gutierrez DOB: [birth year] MRN: [id number]  Date of admission: 01/21/2018 Delivering MD: Cam Hai D   Date of discharge: 01/23/2018  Admitting diagnosis: 37 WEEKS CTX Intrauterine pregnancy: [redacted]w[redacted]d     Secondary diagnosis:  Active Problems:   SVD (spontaneous vaginal delivery)  Additional problems: GBS pos     Discharge diagnosis: Term Pregnancy Delivered                                                                                                Post partum procedures:none  Augmentation: none  Complications: None  Hospital course:  Onset of Labor With Vaginal Delivery     32 y.o. yo [id number] at [redacted]w[redacted]d was admitted in Active Labor on 01/21/2018. Patient had an uncomplicated precipitous labor course, only receiving a partial dose of PCN for GBS prophylaxis.  Membrane Rupture Time/Date: 7:42 AM ,01/21/2018   Intrapartum Procedures: Episiotomy: None [1]                                         Lacerations:  None [1]  Patient had a delivery of a Viable infant. 01/21/2018  Information for the patient's newborn:  Yeng, Frankie [[id number]]  Delivery Method: Vag-Spont    Pateint had an uncomplicated postpartum course.  She is ambulating, tolerating a regular diet, passing flatus, and urinating well. Patient is discharged home in stable condition on 01/23/18.   Physical exam  Vitals:   01/22/18 1300 01/22/18 1427 01/22/18 2208 01/23/18 0531  BP:  107/83 115/80 103/82  Pulse:  66 70 72  Resp:  16 18 16   Temp:  98.4 F (36.9 C) 98.3 F (36.8 C) 98.1 F (36.7 C)  TempSrc:  Oral Oral Oral  SpO2:  99% 100%   Weight: 68 kg     Height: 5\' 1"  (1.549 m)      General: alert and cooperative Lochia: appropriate Uterine Fundus: firm Incision: N/A DVT Evaluation: No evidence of DVT seen on physical exam. Labs: CBC inadvertently left off of admission orders- not collected during this inpt stay New OB hgb: 11.7, pt with EBL of 200cc at  delivery, denies s/s anemia- CBC will not be collected prior to d/c  Discharge instruction: per After Visit Summary and "Baby and Me Booklet".  After visit meds:  Allergies as of 01/23/2018   No Known Allergies     Medication List    TAKE these medications   ibuprofen 600 MG tablet Commonly known as:  ADVIL,MOTRIN Take 1 tablet (600 mg total) by mouth every 6 (six) hours as needed.   prenatal multivitamin Tabs tablet Take 1 tablet by mouth daily at 12 noon.       Diet: routine diet  Activity: Advance as tolerated. Pelvic rest for 6 weeks.   Outpatient follow up:4 weeks Follow up Appt:No future appointments. Follow up Visit:No follow-ups on file.  Postpartum contraception: Depo Provera  Newborn Data: Live born female  Birth Weight: 6 lb  4 oz (2835 g) APGAR: 9, 9  Newborn Delivery   Birth date/time:  01/21/2018 07:42:00 Delivery type:  Vaginal, Spontaneous     Baby Feeding: Bottle and Breast Disposition:home with mother   01/23/2018 Cam HaiSHAW, Shaquel Chavous, CNM  8:50 AM

## 2018-01-23 NOTE — Lactation Note (Signed)
This note was copied from a baby's chart. Lactation Consultation Note  Patient Name: Lindsay Gutierrez [id number] Date: 01/23/2018 Reason for consult: Follow-up assessment;Infant weight loss;Early term 37-38.6wks;Other (Comment);Engorgement(2 % weight loss / boarderline engorged / mom pumping / see LC note )  Baby is 47 hours old,  LC reviewed and updated the doc flow sheets per dad .  As [place] entered the room , mom pumping left breast with the DEBP. LC recommended to pump both and assist. Milk releasing from both, more from the left compared to the right. Due to boarder line engorgement LC provided ice packs x 2 and instructed  Mom to ice both breast for 15 -20 mins , and pump for 10 mins to release down to softness.  LC also resized mom for NS due to the #20 NS being to small , and the #24 NS fit better and per mom more comfortable.  Instructed mom on the use of breast shells between feedings except when sleeping/ for reverse pressure.  No nipple breakdown, skin appears healthy. Sore nipple and engorgement prevention and tx reviewed.  Mom has hand pump and a DEBP kit. LC offered the [facility] DEBP loaner and mom declined.  Per mom active with WIC / [place] and will go home and work with the hand pump and knows to call [place] .  LC also showed mom and dad how to use the DEBP set up manually.  LC reminded mom and dad baby needs to feed at least 8 x's a day , ideally 8-12 x's in 24 hours .  LC offered to request and Lc O/P appt for next week.  And they were both receptive.  Mother informed of post-discharge support and given phone number to the lactation department, including services for phone call assistance; out-patient appointments; and breastfeeding support group. List of other breastfeeding resources in the community given in the handout. Encouraged mother to call for problems or concerns related to breastfeeding.  LC requested and LC O/P appt in the Epic basket for the [facility] to call mom for appt. Next  week.    Maternal Data Has patient been taught Hand Expression?: Yes(LC reviewed )  Feeding Feeding Type: (LC unable to assess feeding sound asleep / recently fed ) Nipple Type: Slow - flow  LATCH Score                   Interventions Interventions: Breast feeding basics reviewed  Lactation Tools Discussed/Used Tools: Shells;Pump;Nipple Shields Nipple shield size: 20;24;Other (comment)(#20 NS was to tight and the #24 NS better fit and per mom more comfortable ) Shell Type: Inverted Breast pump type: Manual;Double-Electric Breast Pump WIC Program: Yes   Consult Status Consult Status: Follow-up Date: ([place] offered to request and LC O/P appt and mom and dad receptive ) Follow-up type: [place] 01/23/2018, 11:09 AM

## 2022-01-30 LAB — OB RESULTS CONSOLE HIV ANTIBODY (ROUTINE TESTING): HIV: NONREACTIVE

## 2022-01-30 LAB — HEPATITIS C ANTIBODY: HCV Ab: NEGATIVE

## 2022-01-30 LAB — OB RESULTS CONSOLE RUBELLA ANTIBODY, IGM: Rubella: IMMUNE

## 2022-01-30 LAB — OB RESULTS CONSOLE HEPATITIS B SURFACE ANTIGEN: Hepatitis B Surface Ag: NEGATIVE

## 2022-02-05 ENCOUNTER — Ambulatory Visit: Payer: Self-pay

## 2022-02-12 ENCOUNTER — Ambulatory Visit: Payer: Self-pay | Attending: Obstetrics & Gynecology

## 2022-06-05 LAB — OB RESULTS CONSOLE GC/CHLAMYDIA
Chlamydia: NEGATIVE
Neisseria Gonorrhea: NEGATIVE

## 2022-06-05 LAB — OB RESULTS CONSOLE GBS: GBS: NEGATIVE

## 2022-06-16 NOTE — L&D Delivery Note (Addendum)
OB/GYN Faculty Practice Delivery Note  Lindsay Gutierrez is a 37 y.o. [id number] s/p SVD at [redacted]w[redacted]d. She was admitted for SOL.   ROM: 1h 64m with clear fluid GBS Status:  Negative/-- (12/21 0000) Maximum Maternal Temperature:  Temp (24hrs), Avg:98.5 F (36.9 C), Min:98.3 F (36.8 C), Max:98.6 F (37 C)    Labor Progress: Patient arrived at 6 cm dilation and augmented with AROM.  Delivery Date/Time: 06/22/2022 at 2041 Delivery: Called to room and patient was complete and pushing. Head delivered in LOA position. No nuchal cord present. Shoulder and body delivered in usual fashion. Infant with spontaneous cry, placed on mother's abdomen, dried and stimulated. Cord clamped x 2 after 1-minute delay, and cut by FOB. Cord blood drawn. Placenta delivered spontaneously with gentle cord traction. Fundus firm with massage and Pitocin. Labia, perineum, vagina, and cervix inspected with no lacerations.   Placenta: Spontaneous, intact, 3 vessel cord  Complications: None Lacerations: None EBL: 153 mL Analgesia: Epidural    Infant: APGAR (1 MIN): 9   APGAR (5 MINS): 9   APGAR (10 MINS):    Weight: Pending   Gifford Shave, MD  OB Fellow  06/22/2022 8:57 PM

## 2022-06-22 ENCOUNTER — Inpatient Hospital Stay (HOSPITAL_COMMUNITY): Payer: Medicaid Other | Admitting: Anesthesiology

## 2022-06-22 ENCOUNTER — Encounter (HOSPITAL_COMMUNITY): Payer: Self-pay | Admitting: *Deleted

## 2022-06-22 ENCOUNTER — Inpatient Hospital Stay (HOSPITAL_COMMUNITY)
Admission: AD | Admit: 2022-06-22 | Discharge: 2022-06-24 | DRG: 807 | Disposition: A | Discharge status: Home / Self Care | Payer: Medicaid Other | Attending: Obstetrics & Gynecology | Admitting: Obstetrics & Gynecology

## 2022-06-22 DIAGNOSIS — Z23 Encounter for immunization: Secondary | ICD-10-CM

## 2022-06-22 DIAGNOSIS — O26893 Other specified pregnancy related conditions, third trimester: Secondary | ICD-10-CM | POA: Diagnosis present

## 2022-06-22 DIAGNOSIS — Z3A37 37 weeks gestation of pregnancy: Secondary | ICD-10-CM

## 2022-06-22 LAB — TYPE AND SCREEN
ABO/RH(D): O POS
Antibody Screen: NEGATIVE

## 2022-06-22 LAB — CBC
HCT: 35.3 % — ABNORMAL LOW (ref 36.0–46.0)
Hemoglobin: 11.3 g/dL — ABNORMAL LOW (ref 12.0–15.0)
MCH: 27.5 pg (ref 26.0–34.0)
MCHC: 32 g/dL (ref 30.0–36.0)
MCV: 85.9 fL (ref 80.0–100.0)
Platelets: 326 10*3/uL (ref 150–400)
RBC: 4.11 MIL/uL (ref 3.87–5.11)
RDW: 15.4 % (ref 11.5–15.5)
WBC: 7.6 10*3/uL (ref 4.0–10.5)
nRBC: 0 % (ref 0.0–0.2)

## 2022-06-22 MED ORDER — ACETAMINOPHEN 325 MG PO TABS
650.0000 mg | ORAL_TABLET | ORAL | Status: DC | PRN
  Administered 2022-06-23 (×2): 650 mg via ORAL
  Filled 2022-06-22 (×2): qty 2

## 2022-06-22 MED ORDER — SOD CITRATE-CITRIC ACID 500-334 MG/5ML PO SOLN: 30.0000 mL | ORAL | Status: DC | PRN

## 2022-06-22 MED ORDER — PHENYLEPHRINE 80 MCG/ML (10ML) SYRINGE FOR IV PUSH (FOR BLOOD PRESSURE SUPPORT): 80.0000 ug | PREFILLED_SYRINGE | INTRAVENOUS | Status: DC | PRN

## 2022-06-22 MED ORDER — ONDANSETRON HCL 4 MG PO TABS: 4.0000 mg | ORAL_TABLET | ORAL | Status: DC | PRN

## 2022-06-22 MED ORDER — LIDOCAINE HCL (PF) 1 % IJ SOLN: 30.0000 mL | INTRAMUSCULAR | Status: DC | PRN

## 2022-06-22 MED ORDER — ONDANSETRON HCL 4 MG/2ML IJ SOLN: 4.0000 mg | INTRAMUSCULAR | Status: DC | PRN

## 2022-06-22 MED ORDER — LACTATED RINGERS IV SOLN: INTRAVENOUS | Status: DC

## 2022-06-22 MED ORDER — FENTANYL-BUPIVACAINE-NACL 0.5-0.125-0.9 MG/250ML-% EP SOLN
12.0000 mL/h | EPIDURAL | Status: DC | PRN
  Filled 2022-06-22: qty 250

## 2022-06-22 MED ORDER — TETANUS-DIPHTH-ACELL PERTUSSIS 5-2.5-18.5 LF-MCG/0.5 IM SUSY
0.5000 mL | PREFILLED_SYRINGE | Freq: Once | INTRAMUSCULAR | Status: AC
  Administered 2022-06-23: 0.5 mL via INTRAMUSCULAR
  Filled 2022-06-22: qty 0.5

## 2022-06-22 MED ORDER — ZOLPIDEM TARTRATE 5 MG PO TABS: 5.0000 mg | ORAL_TABLET | Freq: Every evening | ORAL | Status: DC | PRN

## 2022-06-22 MED ORDER — OXYTOCIN BOLUS FROM INFUSION
333.0000 mL | Freq: Once | INTRAVENOUS | Status: AC
  Administered 2022-06-22: 333 mL via INTRAVENOUS

## 2022-06-22 MED ORDER — LACTATED RINGERS IV SOLN: 500.0000 mL | INTRAVENOUS | Status: DC | PRN

## 2022-06-22 MED ORDER — FENTANYL-BUPIVACAINE-NACL 0.5-0.125-0.9 MG/250ML-% EP SOLN
EPIDURAL | Status: DC | PRN
  Administered 2022-06-22: 12 mL/h via EPIDURAL

## 2022-06-22 MED ORDER — DIBUCAINE (PERIANAL) 1 % EX OINT: 1.0000 | TOPICAL_OINTMENT | CUTANEOUS | Status: DC | PRN

## 2022-06-22 MED ORDER — DIPHENHYDRAMINE HCL 25 MG PO CAPS: 25.0000 mg | ORAL_CAPSULE | Freq: Four times a day (QID) | ORAL | Status: DC | PRN

## 2022-06-22 MED ORDER — LIDOCAINE HCL (PF) 1 % IJ SOLN
INTRAMUSCULAR | Status: DC | PRN
  Administered 2022-06-22: 2 mL via EPIDURAL
  Administered 2022-06-22: 8 mL via EPIDURAL

## 2022-06-22 MED ORDER — FENTANYL CITRATE (PF) 100 MCG/2ML IJ SOLN: 50.0000 ug | INTRAMUSCULAR | Status: DC | PRN

## 2022-06-22 MED ORDER — WITCH HAZEL-GLYCERIN EX PADS: 1.0000 | MEDICATED_PAD | CUTANEOUS | Status: DC | PRN

## 2022-06-22 MED ORDER — OXYCODONE-ACETAMINOPHEN 5-325 MG PO TABS: 2.0000 | ORAL_TABLET | ORAL | Status: DC | PRN

## 2022-06-22 MED ORDER — PRENATAL MULTIVITAMIN CH
1.0000 | ORAL_TABLET | Freq: Every day | ORAL | Status: DC
  Administered 2022-06-23 – 2022-06-24 (×2): 1 via ORAL
  Filled 2022-06-22 (×2): qty 1

## 2022-06-22 MED ORDER — BENZOCAINE-MENTHOL 20-0.5 % EX AERO: 1.0000 | INHALATION_SPRAY | CUTANEOUS | Status: DC | PRN

## 2022-06-22 MED ORDER — OXYTOCIN-SODIUM CHLORIDE 30-0.9 UT/500ML-% IV SOLN
2.5000 [IU]/h | INTRAVENOUS | Status: DC
  Administered 2022-06-22: 2.5 [IU]/h via INTRAVENOUS
  Filled 2022-06-22: qty 500

## 2022-06-22 MED ORDER — DIPHENHYDRAMINE HCL 50 MG/ML IJ SOLN: 12.5000 mg | INTRAMUSCULAR | Status: DC | PRN

## 2022-06-22 MED ORDER — EPHEDRINE 5 MG/ML INJ: 10.0000 mg | INTRAVENOUS | Status: DC | PRN

## 2022-06-22 MED ORDER — ONDANSETRON HCL 4 MG/2ML IJ SOLN: 4.0000 mg | Freq: Four times a day (QID) | INTRAMUSCULAR | Status: DC | PRN

## 2022-06-22 MED ORDER — IBUPROFEN 600 MG PO TABS
600.0000 mg | ORAL_TABLET | Freq: Four times a day (QID) | ORAL | Status: DC
  Administered 2022-06-22 – 2022-06-24 (×7): 600 mg via ORAL
  Filled 2022-06-22 (×7): qty 1

## 2022-06-22 MED ORDER — COCONUT OIL OIL: 1.0000 | TOPICAL_OIL | Status: DC | PRN

## 2022-06-22 MED ORDER — SENNOSIDES-DOCUSATE SODIUM 8.6-50 MG PO TABS
2.0000 | ORAL_TABLET | ORAL | Status: DC
  Administered 2022-06-23 – 2022-06-24 (×2): 2 via ORAL
  Filled 2022-06-22 (×2): qty 2

## 2022-06-22 MED ORDER — SIMETHICONE 80 MG PO CHEW: 80.0000 mg | CHEWABLE_TABLET | ORAL | Status: DC | PRN

## 2022-06-22 MED ORDER — LACTATED RINGERS IV SOLN
500.0000 mL | Freq: Once | INTRAVENOUS | Status: AC
  Administered 2022-06-22: 500 mL via INTRAVENOUS

## 2022-06-22 MED ORDER — FLEET ENEMA 7-19 GM/118ML RE ENEM: 1.0000 | ENEMA | RECTAL | Status: DC | PRN

## 2022-06-22 MED ORDER — ACETAMINOPHEN 325 MG PO TABS: 650.0000 mg | ORAL_TABLET | ORAL | Status: DC | PRN

## 2022-06-22 MED ORDER — OXYCODONE-ACETAMINOPHEN 5-325 MG PO TABS: 1.0000 | ORAL_TABLET | ORAL | Status: DC | PRN

## 2022-06-22 NOTE — H&P (Signed)
HPI: Lindsay Gutierrez is a 37 y.o. year old [phone] female at [redacted]w[redacted]d weeks gestation bu 67 week [place] who presents to MAU reporting Labor. 6 cm dilated. Denies ROM. Scant VB. Gets prenatal care at [facility]   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 Fetal ECHO ordered. Pediatric Surgery to be involved later. BMZ given at 19 weeks; needs repeat at 28 weeks as per MFM > completed 5/13 and 5/14 and 7/16 and 7/17 Ok to deliver at [facility] per MFM on 9/4 Every other week BPP at MFM and weekly NST/OB visit in clinic    Low birth weigh infant in prior pregnancy (5 lbs 6 oz at 39 weeks), currently pregnant 09/27/2013    Clinic [facility]  Dating LMP  Genetic Screen Quad: Negative       Anatomic [place]  right chest mass; surg consult w peds > likely transfer to [facility]  GTT Early:               Third trimester: 77  TDaP vaccine  12/29/2013  Flu vaccine   GBS negative  Contraception Depo Provera  [employer] breast  Circumcision female  Pediatrician   Support Person Norberto (FOB)  '    Low-lying posterior placenta, antepartum 10/25/2013   [redacted]w[redacted]d: 1 cm away from os [X]  Repeat at 32 weeks > resolved at 32 weeks    Medical history non-contributory    Other ectopic pregnancy without intrauterine pregnancy 10/27/2012   Past Surgical History:  Procedure Laterality Date   DILATION AND CURETTAGE OF UTERUS     Family History: family history includes Cancer in her paternal grandfather. Social History:  reports that she has never smoked. She has never used smokeless tobacco. She reports that she does not drink alcohol and does not use  drugs.     Maternal Diabetes: No Genetic Screening: Normal Maternal Ultrasounds/Referrals: Normal Fetal Ultrasounds or other Referrals:  None Maternal Substance Abuse:  No Significant Maternal Medications:  None Significant Maternal Lab Results:  Group B Strep negative Number of Prenatal Visits:greater than 3 verified prenatal visits Other Comments:   AMA  Review of Systems  Constitutional:  Negative for chills and fever.  Eyes:  Negative for visual disturbance.  Gastrointestinal:  Positive for abdominal pain (contractions). Negative for nausea and vomiting.  Genitourinary:  Positive for vaginal bleeding (bloody show).  Neurological:  Negative for headaches.   Maternal Medical History:  Reason for admission: Contractions.  Nausea.  Contractions: Frequency: regular.   Perceived severity is strong.   Fetal activity: Perceived fetal activity is normal.   Prenatal complications: No PIH.   Prenatal Complications - Diabetes: none.  Dilation: 7 Effacement (%): 70 Station: -1 Exam by:: [email or web address], CNM Blood pressure 103/72, pulse 89, temperature 98.6 F (37 C), temperature source Oral, resp. rate 18, height 5\' 2"  (1.575 m), weight 73.5  kg, SpO2 100 %, unknown if currently breastfeeding. Maternal Exam:  Uterine Assessment: Contraction strength is firm.  Contraction frequency is regular.  Abdomen: Patient reports no abdominal tenderness. Estimated fetal weight is 7 lb.   Fetal presentation: vertex Introitus: Normal vulva. Normal vagina.  Pelvis: adequate for delivery.   Cervix: Cervix evaluated by digital exam.     Fetal Exam Fetal Monitor Review: Baseline rate: 135.  Variability: moderate (6-25 bpm).   Pattern: accelerations present and no decelerations.   Fetal State Assessment: Category I - tracings are normal.  Physical Exam Constitutional:      General: She is in acute distress.     Appearance: She is not toxic-appearing.  HENT:     Head: Normocephalic.  Eyes:      Conjunctiva/sclera: Conjunctivae normal.  Cardiovascular:     Rate and Rhythm: Normal rate.  Pulmonary:     Effort: Pulmonary effort is normal. No respiratory distress.  Abdominal:     Palpations: Abdomen is soft.     Tenderness: There is no abdominal tenderness.  Genitourinary:    General: Normal vulva.  Musculoskeletal:     Right lower leg: No edema.     Left lower leg: No edema.  Neurological:     Mental Status: She is alert and oriented to person, place, and time.  Psychiatric:        Mood and Affect: Mood normal.     Prenatal labs: ABO, Rh: --/--/PENDING (01/07 1520) Antibody: PENDING (01/07 1520) Rubella: Immune (08/17 0000) RPR:   NR HBsAg: Negative (08/17 0000)  HIV: Non-reactive (08/17 0000)  GBS: Negative/-- (12/21 0000)  GTT 94 Quad screen neg  Assessment: 1. Labor: Active, progressing spontaneously  2. Fetal Wellbeing: Category I  3. Pain Control: Requesting epidural 4. GBS: Neg 5. 37.6 week IUP  Plan:  1. Admit to BS per consult with MD 2. Routine L&D orders 3. Analgesia/anesthesia PRN   Manya Silvas 06/22/2022, 4:22 PM

## 2022-06-22 NOTE — MAU Note (Signed)
Lindsay Gutierrez is a 37 y.o. at [redacted]w[redacted]d here in MAU reporting: having contractions every 63min.  Started at 0900.  Denies bleeding or LOF.  "Not much" fetal movement. Denies problems with BP or GDM.  Has not had cervix checked.  Onset of complaint: 0900 Pain score: 5 Vitals:   06/22/22 1439  BP: 99/65  Pulse: 79  Resp: 16  Temp: 98.3 F (36.8 C)  SpO2: 100%     FHT:121 Lab orders placed from triage:

## 2022-06-22 NOTE — Discharge Summary (Signed)
Entire encounter performed in presence of interpreter: Maxine Glenn #[id number]     Postpartum Discharge Summary     Patient Name: Lindsay Gutierrez DOB: [birth year] MRN: [id number]  Date of admission: 06/22/2022 Delivery date:06/22/2022  Delivering provider: Celedonio Savage  Date of discharge: 06/24/2022  Admitting diagnosis: Labor and delivery, indication for care [O75.9] Intrauterine pregnancy: [redacted]w[redacted]d     Secondary diagnosis:  Principal Problem:   Labor and delivery, indication for care Active Problems:   SVD (spontaneous vaginal delivery)  Additional problems: n/a    Discharge diagnosis: Term Pregnancy Delivered                                              Post partum procedures: n/a Augmentation: AROM Complications: None  Hospital course: Onset of Labor With Vaginal Delivery      37 y.o. yo [id number] at [redacted]w[redacted]d was admitted in Active Labor on 06/22/2022. Labor course was uncomplicated. Membrane Rupture Time/Date: 7:25 PM ,06/22/2022   Delivery Method:Vaginal, Spontaneous  Episiotomy: None  Lacerations:  None  Patient had an uncomplicated postpartum course.  She is ambulating, tolerating a regular diet, passing flatus, and urinating well. Patient is discharged home in stable condition on 06/24/22.  Newborn Data: Birth date:06/22/2022  Birth time:8:41 PM  Gender:Female  Living status:Living  Apgars:9 ,9  Weight:3090 g   Magnesium Sulfate received: No BMZ received: No Rhophylac:N/A MMR:N/A T-DaP:Given postpartum Flu: Yes 02/14/2022 Transfusion:No  Physical exam  Vitals:   06/23/22 0745 06/23/22 1215 06/23/22 2246 06/24/22 0656  BP: 94/69 99/74 109/82 107/78  Pulse: 67 81 69 74  Resp: 18 18 18 18   Temp: 98.2 F (36.8 C) 98 F (36.7 C) 98.3 F (36.8 C) 98.3 F (36.8 C)  TempSrc: Oral Oral Oral Oral  SpO2:    100%  Weight:      Height:       General: alert, cooperative, and no distress Lochia: appropriate Uterine Fundus: firm Incision: N/A DVT Evaluation: No evidence of DVT seen on  physical exam. Labs: Lab Results  Component Value Date   WBC 11.3 (H) 06/23/2022   HGB 11.3 (L) 06/23/2022   HCT 35.3 (L) 06/23/2022   MCV 84.9 06/23/2022   PLT 298 06/23/2022      Latest Ref Rng & Units 06/23/2022   12:11 AM  CMP  Glucose 70 - 99 mg/dL 89   BUN 6 - 20 mg/dL 5   Creatinine 08/22/2022 - 8.31 mg/dL 5.17   Sodium 6.16 - 073 mmol/L 135   Potassium 3.5 - 5.1 mmol/L 3.7   Chloride 98 - 111 mmol/L 105   CO2 22 - 32 mmol/L 22   Calcium 8.9 - 10.3 mg/dL 9.2   Total Protein 6.5 - 8.1 g/dL 5.7   Total Bilirubin 0.3 - 1.2 mg/dL 0.3   Alkaline Phos 38 - 126 U/L 988   AST 15 - 41 U/L 19   ALT 0 - 44 U/L 10    Edinburgh Score:    06/23/2022    7:45 AM  Edinburgh Postnatal Depression Scale Screening Tool  I have been able to laugh and see the funny side of things. 0  I have looked forward with enjoyment to things. 0  I have blamed myself unnecessarily when things went wrong. 0  I have been anxious or worried for no good reason. 0  I have felt scared or panicky for  no good reason. 0  Things have been getting on top of me. 1  I have been so unhappy that I have had difficulty sleeping. 0  I have felt sad or miserable. 0  I have been so unhappy that I have been crying. 0  The thought of harming myself has occurred to me. 0  Edinburgh Postnatal Depression Scale Total 1     After visit meds:  Allergies as of 06/24/2022   No Known Allergies      Medication List     TAKE these medications    acetaminophen 325 MG tablet Commonly known as: Tylenol Take 2 tablets (650 mg total) by mouth every 4 (four) hours as needed (for pain scale < 4).   ibuprofen 600 MG tablet Commonly known as: ADVIL Take 1 tablet (600 mg total) by mouth every 6 (six) hours. What changed:  when to take this reasons to take this   prenatal multivitamin Tabs tablet Take 1 tablet by mouth daily at 12 noon.         Discharge home in stable condition Infant Feeding: Breast Infant  Disposition:home with mother Discharge instruction: per After Visit Summary and Postpartum booklet. Activity: Advance as tolerated. Pelvic rest for 6 weeks.  Diet: routine diet Future Appointments:No future appointments. Follow up Visit:   Please schedule this patient for a In person postpartum visit in 4 weeks with the following provider: Any provider. Additional Postpartum F/U: None   Low risk pregnancy complicated by:  NA Delivery mode:  Vaginal, Spontaneous  Anticipated Birth Control:  IUD   06/24/2022 Naaman Plummer Autry-Lott, DO

## 2022-06-22 NOTE — Anesthesia Procedure Notes (Signed)
Epidural Patient location during procedure: OB Start time: 06/22/2022 4:38 PM End time: 06/22/2022 4:46 PM  Staffing Anesthesiologist: Pervis Hocking, DO Performed: anesthesiologist   Preanesthetic Checklist Completed: patient identified, IV checked, risks and benefits discussed, monitors and equipment checked, pre-op evaluation and timeout performed  Epidural Patient position: sitting Prep: DuraPrep and site prepped and draped Patient monitoring: continuous pulse ox, blood pressure, heart rate and cardiac monitor Approach: midline Location: L3-L4 Injection technique: LOR air  Needle:  Needle type: Tuohy  Needle gauge: 17 G Needle length: 9 cm Needle insertion depth: 6 cm Catheter type: closed end flexible Catheter size: 19 Gauge Catheter at skin depth: 11 cm Test dose: negative  Assessment Sensory level: T8 Events: blood not aspirated, no cerebrospinal fluid, injection not painful, no injection resistance, no paresthesia and negative IV test  Additional Notes Patient identified. Risks/Benefits/Options discussed with patient including but not limited to bleeding, infection, nerve damage, paralysis, failed block, incomplete pain control, headache, blood pressure changes, nausea, vomiting, reactions to medication both or allergic, itching and postpartum back pain. Confirmed with bedside nurse the patient's most recent platelet count. Confirmed with patient that they are not currently taking any anticoagulation, have any bleeding history or any family history of bleeding disorders. Patient expressed understanding and wished to proceed. All questions were answered. Sterile technique was used throughout the entire procedure. Please see nursing notes for vital signs. Test dose was given through epidural catheter and negative prior to continuing to dose epidural or start infusion. Warning signs of high block given to the patient including shortness of breath, tingling/numbness in  hands, complete motor block, or any concerning symptoms with instructions to call for help. Patient was given instructions on fall risk and not to get out of bed. All questions and concerns addressed with instructions to call with any issues or inadequate analgesia.  Reason for block:procedure for pain

## 2022-06-22 NOTE — Anesthesia Preprocedure Evaluation (Signed)
Anesthesia Evaluation  Patient identified by MRN, date of birth, ID band Patient awake    Reviewed: Allergy & Precautions, Patient's Chart, lab work & pertinent test results  Airway Mallampati: II  TM Distance: >3 FB Neck ROM: Full    Dental no notable dental hx.    Pulmonary neg pulmonary ROS   Pulmonary exam normal breath sounds clear to auscultation       Cardiovascular negative cardio ROS Normal cardiovascular exam Rhythm:Regular Rate:Normal     Neuro/Psych negative neurological ROS  negative psych ROS   GI/Hepatic negative GI ROS, Neg liver ROS,,,  Endo/Other  negative endocrine ROS    Renal/GU negative Renal ROS  negative genitourinary   Musculoskeletal negative musculoskeletal ROS (+)    Abdominal   Peds negative pediatric ROS (+)  Hematology  (+) Blood dyscrasia, anemia Hb 11.3, plt 326   Anesthesia Other Findings   Reproductive/Obstetrics (+) Pregnancy                             Anesthesia Physical Anesthesia Plan  ASA: 2  Anesthesia Plan: Epidural   Post-op Pain Management:    Induction:   PONV Risk Score and Plan: 2  Airway Management Planned: Natural Airway  Additional Equipment: None  Intra-op Plan:   Post-operative Plan:   Informed Consent: I have reviewed the patients History and Physical, chart, labs and discussed the procedure including the risks, benefits and alternatives for the proposed anesthesia with the patient or authorized representative who has indicated his/her understanding and acceptance.       Plan Discussed with:   Anesthesia Plan Comments:        Anesthesia Quick Evaluation

## 2022-06-23 LAB — COMPREHENSIVE METABOLIC PANEL
ALT: 10 U/L (ref 0–44)
AST: 19 U/L (ref 15–41)
Albumin: 2.2 g/dL — ABNORMAL LOW (ref 3.5–5.0)
Alkaline Phosphatase: 988 U/L — ABNORMAL HIGH (ref 38–126)
Anion gap: 8 (ref 5–15)
BUN: 5 mg/dL — ABNORMAL LOW (ref 6–20)
CO2: 22 mmol/L (ref 22–32)
Calcium: 9.2 mg/dL (ref 8.9–10.3)
Chloride: 105 mmol/L (ref 98–111)
Creatinine, Ser: 0.5 mg/dL (ref 0.44–1.00)
GFR, Estimated: >60 mL/min (ref >60)
Glucose, Bld: 89 mg/dL (ref 70–99)
Potassium: 3.7 mmol/L (ref 3.5–5.1)
Sodium: 135 mmol/L (ref 135–145)
Total Bilirubin: 0.3 mg/dL (ref 0.3–1.2)
Total Protein: 5.7 g/dL — ABNORMAL LOW (ref 6.5–8.1)

## 2022-06-23 LAB — CBC
HCT: 35.3 % — ABNORMAL LOW (ref 36.0–46.0)
Hemoglobin: 11.3 g/dL — ABNORMAL LOW (ref 12.0–15.0)
MCH: 27.2 pg (ref 26.0–34.0)
MCHC: 32 g/dL (ref 30.0–36.0)
MCV: 84.9 fL (ref 80.0–100.0)
Platelets: 298 10*3/uL (ref 150–400)
RBC: 4.16 MIL/uL (ref 3.87–5.11)
RDW: 15.4 % (ref 11.5–15.5)
WBC: 11.3 10*3/uL — ABNORMAL HIGH (ref 4.0–10.5)
nRBC: 0 % (ref 0.0–0.2)

## 2022-06-23 LAB — RPR: RPR Ser Ql: NONREACTIVE

## 2022-06-23 LAB — PROTEIN / CREATININE RATIO, URINE
Creatinine, Urine: 38 mg/dL
Protein Creatinine Ratio: 0.37 mg/mg{Cre} — ABNORMAL HIGH (ref 0.00–0.15)
Total Protein, Urine: 14 mg/dL

## 2022-06-23 MED ORDER — INFLUENZA VAC SPLIT QUAD 0.5 ML IM SUSY
0.5000 mL | PREFILLED_SYRINGE | INTRAMUSCULAR | Status: DC
  Filled 2022-06-23: qty 0.5

## 2022-06-23 NOTE — Anesthesia Postprocedure Evaluation (Signed)
Anesthesia Post Note  Patient: Lindsay Gutierrez  Procedure(s) Performed: AN AD HOC LABOR EPIDURAL     Patient location during evaluation: Mother Baby Anesthesia Type: Epidural Level of consciousness: awake and alert Pain management: pain level controlled Vital Signs Assessment: post-procedure vital signs reviewed and stable Respiratory status: spontaneous breathing, nonlabored ventilation and respiratory function stable Cardiovascular status: stable Postop Assessment: no headache, no backache and epidural receding Anesthetic complications: no   No notable events documented.  Last Vitals:  Vitals:   06/22/22 2353 06/23/22 0334  BP: 116/76 101/73  Pulse: 78 70  Resp: 18 18  Temp: 36.7 C 36.8 C  SpO2: 99%     Last Pain:  Vitals:   06/23/22 0516  TempSrc:   PainSc: Asleep   Pain Goal:                   Gilmer Mor

## 2022-06-23 NOTE — Progress Notes (Signed)
POSTPARTUM PROGRESS NOTE  Post Partum Day #1  Subjective:  Lindsay Gutierrez is a 37 y.o. [id number] s/p SVD at [redacted]w[redacted]d for SOL.  Per nursing reports of headache overnight, has since resolved and no further concerns. Pt denies problems with ambulating, voiding or po intake. She denies nausea or vomiting.  Pain is well controlled. She has had flatus. She has not had bowel movement.  Lochia Small.   Objective: Blood pressure 101/73, pulse 70, temperature 98.3 F (36.8 C), temperature source Oral, resp. rate 18, height 5\' 2"  (1.575 m), weight 73.5 kg, SpO2 99 %, unknown if currently breastfeeding.  Physical Exam:  General: alert, cooperative and no distress Chest: no respiratory distress Heart:regular rate, distal pulses intact Abdomen: soft, nontender,  Uterine Fundus: firm, appropriately tender DVT Evaluation: No calf swelling or tenderness Extremities: no peripheral edema Skin: warm, dry  Recent Labs    06/22/22 1522 06/23/22 0011  HGB 11.3* 11.3*  HCT 35.3* 35.3*    Assessment/Plan: Lindsay Gutierrez is a 37 y.o. [id number] s/p SVD at [redacted]w[redacted]d for SOL  PPD#1 - Doing well, pain controlled. Contraception: Paraguard Feeding: Breast and bottle feeding, mostly bottle feeding at this time as baby has not been latching well Dispo: Plan for discharge tomorrow (1/9)   LOS: 1 day   Colletta Maryland, MD, CNM 06/23/2022, 8:39 AM

## 2022-06-23 NOTE — Lactation Note (Signed)
This note was copied from a baby's chart. Lactation Consultation Note  Patient Name: Boy Genny Caulder [id number] Date: 06/23/2022 Reason for consult: Initial assessment Age:37 hours  Spanish interpreter used. P4, Mother states wants to breastfeed and formula feed but states she will formula feed until her milk transitions. Provided mother with manual pump.  Sent [facility] loaner referral. Mom made aware of O/P services, breastfeeding support groups, community resources, and our phone # for post-discharge questions in Spanish.    Maternal Data Has patient been taught Hand Expression?: Yes Does the patient have breastfeeding experience prior to this delivery?: Yes How long did the patient breastfeed?:  (pumped for 3 mos.)  Feeding Mother's Current Feeding Choice: Breast Milk and Formula Nipple Type: Slow - flow Lactation Tools Discussed/Used Tools: Pump Breast pump type: Manual Reason for Pumping: stimulation  Interventions Interventions: Breast feeding basics reviewed;Education;Hand pump  Discharge    Consult Status Consult Status: Follow-up Date: 06/24/22 Follow-up type: In-patient    Vivianne Master [facility] 06/23/2022, 8:31 AM

## 2022-06-24 MED ORDER — ACETAMINOPHEN 325 MG PO TABS: 650.0000 mg | ORAL_TABLET | ORAL | 0 refills | Status: AC | PRN

## 2022-06-24 MED ORDER — IBUPROFEN 600 MG PO TABS: 600.0000 mg | ORAL_TABLET | Freq: Four times a day (QID) | ORAL | 0 refills | Status: AC

## 2022-06-24 NOTE — Lactation Note (Signed)
This note was copied from a baby's chart. Lactation Consultation Note  Patient Name: Lindsay Gutierrez [id number] Date: 06/24/2022 Reason for consult: Follow-up assessment Age:37 hours  Spanish interpreter used.  Mother requested NS. Fitted with #20 NS. Reviewed engorgement care and monitoring voids/stools. Observed latch and NS worked well.  Feeding Mother's Current Feeding Choice: Breast Milk and Formula  Lactation Tools Discussed/Used Tools: Pump;Nipple Shields Nipple shield size: 20 Breast pump type: Manual  Interventions Interventions: Breast feeding basics reviewed;Education  Discharge Discharge Education: Engorgement and breast care;Warning signs for feeding baby  Consult Status Consult Status: Complete Date: 06/24/22    Vivianne Master [facility] 06/24/2022, 12:33 PM

## 2022-06-30 ENCOUNTER — Telehealth (HOSPITAL_COMMUNITY): Payer: Self-pay | Admitting: *Deleted

## 2022-06-30 NOTE — Telephone Encounter (Signed)
Attempted hospital discharge follow-up call with Language Line interpreter,  Charleston Ropes [phone]. Voicemail answer received. Erline Levine, RN, 06/30/22, [phone]
